# Patient Record
Sex: Female | Born: 1975
Health system: Southern US, Community
[De-identification: ages and names within clinical notes are randomized; demographics above are authoritative.]

## PROBLEM LIST (undated history)

## (undated) DIAGNOSIS — R112 Nausea with vomiting, unspecified: Secondary | ICD-10-CM

## (undated) DIAGNOSIS — H538 Other visual disturbances: Secondary | ICD-10-CM

## (undated) DIAGNOSIS — I10 Essential (primary) hypertension: Secondary | ICD-10-CM

## (undated) DIAGNOSIS — G35 Multiple sclerosis: Secondary | ICD-10-CM

## (undated) DIAGNOSIS — Z9889 Other specified postprocedural states: Secondary | ICD-10-CM

## (undated) DIAGNOSIS — G35D Multiple sclerosis, unspecified: Secondary | ICD-10-CM

## (undated) DIAGNOSIS — F319 Bipolar disorder, unspecified: Secondary | ICD-10-CM

## (undated) HISTORY — DX: Multiple sclerosis, unspecified: G35.D

## (undated) HISTORY — PX: OTHER SURGICAL HISTORY: SHX169

## (undated) HISTORY — PX: SPINE SURGERY: SHX786

## (undated) HISTORY — DX: Bipolar disorder, unspecified: F31.9

## (undated) HISTORY — DX: Multiple sclerosis: G35

---

## 1997-06-22 ENCOUNTER — Ambulatory Visit (HOSPITAL_COMMUNITY): Admission: RE | Admit: 1997-06-22 | Discharge: 1997-06-22 | Payer: Self-pay | Admitting: Obstetrics and Gynecology

## 1997-08-30 ENCOUNTER — Other Ambulatory Visit: Admission: RE | Admit: 1997-08-30 | Discharge: 1997-08-30 | Payer: Self-pay | Admitting: Obstetrics and Gynecology

## 1997-11-10 ENCOUNTER — Inpatient Hospital Stay (HOSPITAL_COMMUNITY): Admission: AD | Admit: 1997-11-10 | Discharge: 1997-11-13 | Payer: Self-pay | Admitting: Obstetrics & Gynecology

## 1997-12-07 ENCOUNTER — Encounter: Payer: Self-pay | Admitting: Pediatrics

## 1997-12-07 ENCOUNTER — Ambulatory Visit (HOSPITAL_COMMUNITY): Admission: RE | Admit: 1997-12-07 | Discharge: 1997-12-07 | Payer: Self-pay | Admitting: Pediatrics

## 1998-11-20 ENCOUNTER — Encounter: Payer: Self-pay | Admitting: General Practice

## 1998-11-20 ENCOUNTER — Ambulatory Visit (HOSPITAL_COMMUNITY): Admission: RE | Admit: 1998-11-20 | Discharge: 1998-11-20 | Payer: Self-pay | Admitting: General Practice

## 2001-07-09 ENCOUNTER — Encounter: Payer: Self-pay | Admitting: Pediatrics

## 2001-07-09 ENCOUNTER — Ambulatory Visit (HOSPITAL_COMMUNITY): Admission: RE | Admit: 2001-07-09 | Discharge: 2001-07-09 | Payer: Self-pay | Admitting: Pediatrics

## 2001-09-10 ENCOUNTER — Other Ambulatory Visit: Admission: RE | Admit: 2001-09-10 | Discharge: 2001-09-10 | Payer: Self-pay | Admitting: Obstetrics and Gynecology

## 2002-03-14 ENCOUNTER — Encounter: Payer: Self-pay | Admitting: Pediatrics

## 2002-03-14 ENCOUNTER — Ambulatory Visit (HOSPITAL_COMMUNITY): Admission: RE | Admit: 2002-03-14 | Discharge: 2002-03-14 | Payer: Self-pay | Admitting: Pediatrics

## 2002-09-05 ENCOUNTER — Emergency Department (HOSPITAL_COMMUNITY): Admission: EM | Admit: 2002-09-05 | Discharge: 2002-09-05 | Payer: Self-pay | Admitting: Emergency Medicine

## 2002-11-01 ENCOUNTER — Encounter: Payer: Self-pay | Admitting: Pediatrics

## 2002-11-01 ENCOUNTER — Ambulatory Visit (HOSPITAL_COMMUNITY): Admission: RE | Admit: 2002-11-01 | Discharge: 2002-11-01 | Payer: Self-pay | Admitting: Pediatrics

## 2005-07-06 ENCOUNTER — Emergency Department (HOSPITAL_COMMUNITY): Admission: EM | Admit: 2005-07-06 | Discharge: 2005-07-06 | Payer: Self-pay | Admitting: Emergency Medicine

## 2005-07-08 ENCOUNTER — Ambulatory Visit (HOSPITAL_COMMUNITY): Admission: RE | Admit: 2005-07-08 | Discharge: 2005-07-08 | Payer: Self-pay | Admitting: Emergency Medicine

## 2016-04-27 ENCOUNTER — Other Ambulatory Visit: Payer: Self-pay | Admitting: Family Medicine

## 2016-04-27 MED ORDER — OSELTAMIVIR PHOSPHATE 75 MG PO CAPS: 75.0000 mg | ORAL_CAPSULE | Freq: Every day | ORAL | 0 refills | Status: DC

## 2016-04-27 NOTE — Telephone Encounter (Signed)
tamiflu sent for flu contact in family. PPx dosing   Pt states 4 years since seen in our clinic.   Murtis Sink, MD [facility] 04/27/2016, 4:52 PM

## 2016-06-10 ENCOUNTER — Encounter: Payer: Self-pay | Admitting: Pediatrics

## 2016-06-10 ENCOUNTER — Ambulatory Visit: Payer: Self-pay | Admitting: Pediatrics

## 2016-06-10 ENCOUNTER — Ambulatory Visit (INDEPENDENT_AMBULATORY_CARE_PROVIDER_SITE_OTHER): Payer: BLUE CROSS/BLUE SHIELD | Admitting: Pediatrics

## 2016-06-10 VITALS — BP 136/88 | HR 84 | Temp 98.0°F | Ht 67.0 in | Wt 162.6 lb

## 2016-06-10 DIAGNOSIS — G4700 Insomnia, unspecified: Secondary | ICD-10-CM

## 2016-06-10 DIAGNOSIS — R030 Elevated blood-pressure reading, without diagnosis of hypertension: Secondary | ICD-10-CM

## 2016-06-10 DIAGNOSIS — I10 Essential (primary) hypertension: Secondary | ICD-10-CM | POA: Insufficient documentation

## 2016-06-10 DIAGNOSIS — F411 Generalized anxiety disorder: Secondary | ICD-10-CM

## 2016-06-10 DIAGNOSIS — G35 Multiple sclerosis: Secondary | ICD-10-CM | POA: Diagnosis not present

## 2016-06-10 MED ORDER — CITALOPRAM HYDROBROMIDE 10 MG PO TABS: 20.0000 mg | ORAL_TABLET | Freq: Every day | ORAL | 1 refills | Status: DC

## 2016-06-10 NOTE — Patient Instructions (Signed)
Take 10mg  citalopram for 8 days then start two tabs Take melatonin 3-9mg  30 min before bed

## 2016-06-10 NOTE — Progress Notes (Signed)
Subjective:   Patient ID: Angela Hull, female    DOB: 02/24/1976, 41 y.o.   MRN: [id number] CC: New Patient (Initial Visit) anxiety HPI: Angela Hull is a 41 y.o. female presenting for New Patient (Initial Visit)  Diagnosed with "manic depression" as a teenager Has ben non wellbutrin, zoloft Has a really good day every 2 weeks Most of the time feels really anxious A couple times a year gets so anxious she calls it "terror mode" that anything to get out including hurting herself would be better than feeling the anxiety Grandfather died recently Was in terror mode to get onto airplane for husband's business trip a few weeks ago Has upcoming trip to [place] Mood is not much of a problem now Has never done anything to hurt herself before Last time she wished she wasn't here bc of anxiety she says was 6-8 mo ago  Hard time falling asleep, worrying all the time Used to take benadryl years ago, got really worried about it because then took it every day  MS: no recent symptoms, has been years Last infusion 15+ yrs ago Not interested in establishing care with neurology  Here today with her husband who has been v supportive per pt  GAD 7 : Generalized Anxiety Score 06/10/2016  Nervous, Anxious, on Edge 3  Control/stop worrying 3  Worry too much - different things 3  Trouble relaxing 3  Restless 3  Easily annoyed or irritable 2  Afraid - awful might happen 2  Total GAD 7 Score 19  Anxiety Difficulty Very difficult   Depression screen PHQ 2/9 06/10/2016  Decreased Interest 3  Down, Depressed, Hopeless 3  PHQ - 2 Score 6  Altered sleeping 3  Tired, decreased energy 0  Change in appetite 0  Feeling bad or failure about yourself  3  Trouble concentrating 2  Moving slowly or fidgety/restless 2  Suicidal thoughts 0  PHQ-9 Score 16  Difficult doing work/chores Very difficult     Past Medical History:  Diagnosis Date  . Manic depressive disorder ([place])   . Multiple sclerosis ([place])     Family History  Problem Relation Age of Onset  . Asthma Maternal Grandmother    Social History   Social History  . Marital status: Married    Spouse name: N/A  . Number of children: N/A  . Years of education: N/A   Social History Main Topics  . Smoking status: Current Every Day Smoker    Packs/day: 0.50    Types: Cigarettes  . Smokeless tobacco: Never Used  . Alcohol use Yes     Comment: Occasional  . Drug use: No  . Sexual activity: Yes   Other Topics Concern  . None   Social History Narrative  . None   ROS: All systems negative other than what is in HPI  Objective:    BP 136/88   Pulse 84   Temp 98 F (36.7 C) (Oral)   Ht '5\' 7"'$  (1.702 m)   Wt 162 lb 9.6 oz (73.8 kg)   BMI 25.47 kg/m   Wt Readings from Last 3 Encounters:  06/10/16 162 lb 9.6 oz (73.8 kg)    Gen: NAD, alert, cooperative with exam, NCAT EYES: EOMI, +conjunctival injection b/l though pt crying, or no icterus ENT:  TMs pearly gray b/l, OP without erythema LYMPH: no cervical LAD Neck: nl thyroid CV: NRRR, normal S1/S2, no murmur, distal pulses 2+ b/l Resp: CTABL, no wheezes, normal WOB Abd: +BS, soft, NTND. no  guarding or organomegaly Ext: No edema, warm Neuro: Alert and oriented, strength equal b/l UE and LE, coordination grossly normal MSK: normal muscle bulk Psych: tearful, mood is "fine", full affect  Assessment & Plan:  [name] was seen today for new patient (initial visit) and anxiety  Diagnoses and all orders for this visit:  Generalized anxiety disorder Uncontrolled Says she feels safe at home Start citalopram, take half tab first week then full tab Check below labs -     BMP8+EGFR -     TSH -     citalopram (CELEXA) 10 MG tablet; Take 2 tablets (20 mg total) by mouth daily.  Insomnia, unspecified type Discussed sleep hygiene, can try melatonin Suspect once anxiety is better controlled, will improve  Multiple sclerosis ([place]) No symptoms now Pt does not want f/u with  neurology at this time  Elevated BP Recheck next visit Pt anxious, tearful today  Follow up plan: 3 weeks, sooner if needed discussed return precautions Assunta Found, MD [place]

## 2016-06-11 LAB — BMP8+EGFR
BUN/Creatinine Ratio: 10 (ref 9–23)
BUN: 7 mg/dL (ref 6–20)
CALCIUM: 9.2 mg/dL (ref 8.7–10.2)
CO2: 22 mmol/L (ref 18–29)
CREATININE: 0.68 mg/dL (ref 0.57–1.00)
Chloride: 98 mmol/L (ref 96–106)
GFR, EST AFRICAN AMERICAN: 127 mL/min/{1.73_m2} (ref >59)
GFR, EST NON AFRICAN AMERICAN: 111 mL/min/{1.73_m2} (ref >59)
Glucose: 93 mg/dL (ref 65–99)
Potassium: 4.2 mmol/L (ref 3.5–5.2)
Sodium: 136 mmol/L (ref 134–144)

## 2016-06-11 LAB — TSH: TSH: 0.528 u[IU]/mL (ref 0.450–4.500)

## 2016-07-08 ENCOUNTER — Ambulatory Visit (INDEPENDENT_AMBULATORY_CARE_PROVIDER_SITE_OTHER): Payer: BLUE CROSS/BLUE SHIELD | Admitting: Pediatrics

## 2016-07-08 ENCOUNTER — Encounter: Payer: Self-pay | Admitting: Pediatrics

## 2016-07-08 VITALS — BP 144/95 | HR 65 | Temp 97.3°F | Ht 67.0 in | Wt 161.8 lb

## 2016-07-08 DIAGNOSIS — R030 Elevated blood-pressure reading, without diagnosis of hypertension: Secondary | ICD-10-CM | POA: Diagnosis not present

## 2016-07-08 DIAGNOSIS — F411 Generalized anxiety disorder: Secondary | ICD-10-CM | POA: Diagnosis not present

## 2016-07-08 MED ORDER — CITALOPRAM HYDROBROMIDE 20 MG PO TABS: 40.0000 mg | ORAL_TABLET | Freq: Every day | ORAL | 4 refills | Status: DC

## 2016-07-08 NOTE — Progress Notes (Signed)
  Subjective:   Patient ID: Angela Hull, female    DOB: 01-17-1976, 41 y.o.   MRN: [id number] CC: Follow-up (Depression, )  HPI: Angela Hull is a 41 y.o. female presenting for Follow-up (Depression, )  Feels like she has noticed improvement in worrying and anxiety since starting med Sleep also better Taking 10mg  melatonin every night Taking benadryl every other day to help with sleep Has a gym membership., work has been very busy  upcoming plane trip cancelled No thoughts of self harm, not wanting to be here  Says BPs usually elevated when she comes to doctor  Relevant past medical, surgical, family and social history reviewed. Allergies and medications reviewed and updated. History  Smoking Status  . Current Every Day Smoker  . Packs/day: 0.50  . Types: Cigarettes  Smokeless Tobacco  . Never Used   ROS: Per HPI   Objective:    BP (!) 144/95   Pulse 65   Temp 97.3 F (36.3 C) (Oral)   Ht 5\' 7"  (1.702 m)   Wt 161 lb 12.8 oz (73.4 kg)   BMI 25.34 kg/m   Wt Readings from Last 3 Encounters:  07/08/16 161 lb 12.8 oz (73.4 kg)  06/10/16 162 lb 9.6 oz (73.8 kg)    Gen: NAD, alert, cooperative with exam, NCAT EYES: EOMI, no conjunctival injection, or no icterus CV: NRRR, normal S1/S2, no murmur, distal pulses 2+ b/l Resp: CTABL, no wheezes, normal WOB Ext: No edema, warm Neuro: Alert and oriented, strength equal b/l UE and LE, coordination grossly normal MSK: normal muscle bulk Psych: affect much improved, no thoughts of self harm  Assessment & Plan:  Angela Hull was seen today for follow-up multiple med problems.  Diagnoses and all orders for this visit:  Elevated blood pressure reading Very nervous today Will rtc for recheck next week  Generalized anxiety disorder Ongoing symptoms though improved Will increase to 40mg  daily Feels safe at home -     citalopram (CELEXA) 20 MG tablet; Take 2 tablets (40 mg total) by mouth daily.   Follow up plan: Return in  about 6 months (around 01/07/2017). Rex Kras, MD Angela Hull [facility]

## 2016-09-10 ENCOUNTER — Telehealth: Payer: Self-pay | Admitting: Pediatrics

## 2016-09-10 NOTE — Telephone Encounter (Signed)
Pt aware and appt made for Thursday

## 2016-09-10 NOTE — Telephone Encounter (Signed)
Needs to be seen, I have appt spots this afternoon and evening.

## 2016-09-12 ENCOUNTER — Ambulatory Visit: Payer: BLUE CROSS/BLUE SHIELD | Admitting: Pediatrics

## 2016-09-13 ENCOUNTER — Encounter: Payer: Self-pay | Admitting: Pediatrics

## 2016-12-02 ENCOUNTER — Telehealth: Payer: Self-pay | Admitting: Pediatrics

## 2016-12-02 DIAGNOSIS — F411 Generalized anxiety disorder: Secondary | ICD-10-CM

## 2016-12-02 NOTE — Telephone Encounter (Signed)
Ok to fill 

## 2016-12-02 NOTE — Telephone Encounter (Signed)
What is the name of the medication? antidepressant  Have you contacted your pharmacy to request a refill? Yes, no refills. Has appt on 10/18  Which pharmacy would you like this sent to? [facility].   Patient notified that their request is being sent to the clinical staff for review and that they should receive a call once it is complete. If they do not receive a call within 24 hours they can check with their pharmacy or our office.

## 2016-12-03 MED ORDER — CITALOPRAM HYDROBROMIDE 20 MG PO TABS: 40.0000 mg | ORAL_TABLET | Freq: Every day | ORAL | 4 refills | Status: DC

## 2016-12-03 NOTE — Telephone Encounter (Signed)
Pt aware of meds 

## 2016-12-26 ENCOUNTER — Ambulatory Visit (INDEPENDENT_AMBULATORY_CARE_PROVIDER_SITE_OTHER): Payer: BLUE CROSS/BLUE SHIELD | Admitting: Pediatrics

## 2016-12-26 ENCOUNTER — Encounter: Payer: Self-pay | Admitting: Pediatrics

## 2016-12-26 VITALS — BP 165/95 | HR 70 | Temp 97.6°F | Ht 67.0 in | Wt 169.8 lb

## 2016-12-26 DIAGNOSIS — F411 Generalized anxiety disorder: Secondary | ICD-10-CM | POA: Diagnosis not present

## 2016-12-26 DIAGNOSIS — G4700 Insomnia, unspecified: Secondary | ICD-10-CM

## 2016-12-26 DIAGNOSIS — R030 Elevated blood-pressure reading, without diagnosis of hypertension: Secondary | ICD-10-CM | POA: Diagnosis not present

## 2016-12-26 MED ORDER — CITALOPRAM HYDROBROMIDE 20 MG PO TABS: 40.0000 mg | ORAL_TABLET | Freq: Every day | ORAL | 4 refills | Status: DC

## 2016-12-26 MED ORDER — HYDROXYZINE HCL 10 MG PO TABS: 10.0000 mg | ORAL_TABLET | Freq: Three times a day (TID) | ORAL | 3 refills | Status: DC | PRN

## 2016-12-26 MED ORDER — SUVOREXANT 10 MG PO TABS: 10.0000 mg | ORAL_TABLET | Freq: Every day | ORAL | 2 refills | Status: DC

## 2016-12-26 NOTE — Patient Instructions (Addendum)
110s-120s/60s-70s  [email or web address]

## 2016-12-26 NOTE — Progress Notes (Signed)
  Subjective:   Patient ID: Angela Hull, female    DOB: 1976/01/19, 41 y.o.   MRN: [id number] CC: Follow-up (anxiety, depression)  HPI: Angela Hull is a 41 y.o. female presenting for Follow-up (anxiety, depression)  Anxiety: taking citalopram 40mg  daily Helping a lot Has 2-3 times a month has a very anxious day  Insomnia: gets home at 9-930p, hard time getting to sleep  No HA, no CP, no SOB BPs have been normal when she checks at [employer] she says, doesn't remember numbers   Here today with her husband  Relevant past medical, surgical, family and social history reviewed. Allergies and medications reviewed and updated. History  Smoking Status  . Current Every Day Smoker  . Packs/day: 0.50  . Types: Cigarettes  Smokeless Tobacco  . Never Used   ROS: Per HPI   Objective:    BP (!) 165/95   Pulse 70   Temp 97.6 F (36.4 C) (Oral)   Ht 5\' 7"  (1.702 m)   Wt 169 lb 12.8 oz (77 kg)   BMI 26.59 kg/m   Wt Readings from Last 3 Encounters:  12/26/16 169 lb 12.8 oz (77 kg)  07/08/16 161 lb 12.8 oz (73.4 kg)  06/10/16 162 lb 9.6 oz (73.8 kg)    Gen: NAD, alert, cooperative with exam, NCAT EYES: EOMI, no conjunctival injection, or no icterus ENT: OP without erythema LYMPH: no cervical LAD CV: NRRR, normal S1/S2, no murmur, distal pulses 2+ b/l Resp: CTABL, no wheezes, normal WOB Abd: +BS, soft, NTND.  Ext: No edema, warm Neuro: Alert and oriented, strength equal b/l UE and LE, coordination grossly normal MSK: normal muscle bulk  Assessment & Plan:  Angela Hull was seen today for follow-up anxiety  Diagnoses and all orders for this visit:  Generalized anxiety disorder Cont celexa Encouraged counseling, list of counselors given -     hydrOXYzine (ATARAX/VISTARIL) 10 MG tablet; Take 1 tablet (10 mg total) by mouth 3 (three) times daily as needed. -     citalopram (CELEXA) 20 MG tablet; Take 2 tablets (40 mg total) by mouth daily.  Insomnia, unspecified type Trial of  below -     Suvorexant (BELSOMRA) 10 MG TABS; Take 10-20 mg by mouth at bedtime.  Elevated blood pressure reading rtc 1 week for BP recheck Check at home regularly  Follow up plan: Return in about 3 months (around 03/28/2017). Rex Krasarol Nedda Gains, MD [facility]

## 2017-01-09 ENCOUNTER — Telehealth: Payer: Self-pay

## 2017-01-09 NOTE — Telephone Encounter (Signed)
Can you ask pt, did we give her the belsomra copay card? I might not have. I havent had that not go through yet. Insurance denied it with a prior auth but with the card I think it should be fine

## 2017-01-09 NOTE — Telephone Encounter (Signed)
Insurance denied prior auth for Belsomra  Alternatives are Zolpidem, Eszopiclone and Zaleplon

## 2017-05-23 ENCOUNTER — Other Ambulatory Visit: Payer: Self-pay | Admitting: Pediatrics

## 2017-05-23 DIAGNOSIS — F411 Generalized anxiety disorder: Secondary | ICD-10-CM

## 2017-05-23 NOTE — Telephone Encounter (Signed)
What is the name of the medication?  hydrOXYzine (ATARAX/VISTARIL) 10 MG tablet citalopram (CELEXA) 20 MG tablet  Have you contacted your pharmacy to request a refill? Yes no refill  Which pharmacy would you like this sent to? [facility]  pt has appt with Oswaldo Done 06/05/17 at 930.  Patient notified that their request is being sent to the clinical staff for review and that they should receive a call once it is complete. If they do not receive a call within 24 hours they can check with their pharmacy or our office.

## 2017-05-26 ENCOUNTER — Other Ambulatory Visit: Payer: Self-pay | Admitting: Pediatrics

## 2017-05-26 DIAGNOSIS — F411 Generalized anxiety disorder: Secondary | ICD-10-CM

## 2017-05-26 NOTE — Telephone Encounter (Signed)
Fine to refill 30 days worth

## 2017-05-26 NOTE — Telephone Encounter (Signed)
Please advise 

## 2017-05-27 MED ORDER — CITALOPRAM HYDROBROMIDE 20 MG PO TABS: 40.0000 mg | ORAL_TABLET | Freq: Every day | ORAL | 0 refills | Status: DC

## 2017-05-27 NOTE — Telephone Encounter (Signed)
Prescription sent in, no answer on patient's phone mailbox full.

## 2017-06-05 ENCOUNTER — Ambulatory Visit: Payer: BLUE CROSS/BLUE SHIELD | Admitting: Pediatrics

## 2017-06-23 ENCOUNTER — Telehealth: Payer: Self-pay | Admitting: Pediatrics

## 2017-06-23 ENCOUNTER — Encounter: Payer: Self-pay | Admitting: Pediatrics

## 2017-06-23 ENCOUNTER — Ambulatory Visit: Payer: BLUE CROSS/BLUE SHIELD | Admitting: Pediatrics

## 2017-06-23 VITALS — BP 177/109 | HR 80 | Temp 97.1°F | Ht 67.0 in | Wt 178.4 lb

## 2017-06-23 DIAGNOSIS — F411 Generalized anxiety disorder: Secondary | ICD-10-CM

## 2017-06-23 DIAGNOSIS — G4700 Insomnia, unspecified: Secondary | ICD-10-CM | POA: Diagnosis not present

## 2017-06-23 DIAGNOSIS — I10 Essential (primary) hypertension: Secondary | ICD-10-CM | POA: Diagnosis not present

## 2017-06-23 MED ORDER — CITALOPRAM HYDROBROMIDE 40 MG PO TABS: 40.0000 mg | ORAL_TABLET | Freq: Every day | ORAL | 4 refills | Status: DC

## 2017-06-23 MED ORDER — TRAZODONE HCL 50 MG PO TABS: 25.0000 mg | ORAL_TABLET | Freq: Every evening | ORAL | 3 refills | Status: DC | PRN

## 2017-06-23 MED ORDER — AMLODIPINE BESYLATE 2.5 MG PO TABS: 2.5000 mg | ORAL_TABLET | Freq: Every day | ORAL | 3 refills | Status: DC

## 2017-06-23 MED ORDER — SUVOREXANT 10 MG PO TABS: 10.0000 mg | ORAL_TABLET | Freq: Every day | ORAL | 2 refills | Status: DC

## 2017-06-23 MED ORDER — HYDROXYZINE HCL 10 MG PO TABS: ORAL_TABLET | ORAL | 4 refills | Status: DC

## 2017-06-23 NOTE — Patient Instructions (Signed)
Www.[email or web address]  Mental Health Apps and Websites Here are a few free apps meant to help you to help yourself.  To find, try searching on the internet to see if the app is offered on Apple/Android devices. If your first choice doesn't come up on your device, the good news is that there are many choices! Play around with different apps to see which ones are helpful to you . Calm This is an app meant to help increase calm feelings. Includes info, strategies, and tools for tracking your feelings.   Calm Harm  This app is meant to help with self-harm. Provides many 5-minute or 15-min coping strategies for doing instead of hurting yourself.    Healthy Minds Health Minds is a problem-solving tool to help deal with emotions and cope with stress you encounter wherever you are.    MindShift This app can help people cope with anxiety. Rather than trying to avoid anxiety, you can make an important shift and face it.    MY3  MY3 features a support system, safety plan and resources with the goal of offering a tool to use in a time of need.    My Life My Voice  This mood journal offers a simple solution for tracking your thoughts, feelings and moods. Animated emoticons can help identify your mood.   Relax Melodies Designed to help with sleep, on this app you can mix sounds and meditations for relaxation.    Smiling Mind Smiling Mind is meditation made easy: it's a simple tool that helps put a smile on your mind.    Stop, Breathe & Think  A friendly, simple guide for people through meditations for mindfulness and compassion.  Stop, Breathe and Think Kids Enter your current feelings and choose a "mission" to help you cope. Offers videos for certain moods instead of just sound recordings.     The [employer] Box The [employer] Box (VHB) contains simple tools to help patients with coping, relaxation, distraction, and positive thinking.    Your provider wants you to schedule an appointment  with a Psychologist/Psychiatrist. The following list of offices requires the patient to call and make their own appointment, as there is information they need that only you can provide. Please feel free to choose form the following providers:  [facility]   [phone] Crisis Recovery in [place] [phone]  [facility]  [phone] [place], [place]  (Scheduled through [facility]) Must call and do an interview for appointment. Sees Children / Accepts Medicaid  [facility]    [phone]  [address]    [place], [place]       [facility]  [phone] [address] [place], [place]  Evaluates for Autism but does not treat it Sees Children / Accepts Medicaid  [facility]    [phone] [address]   [place], [place] Medication management, substance abuse, bipolar, grief, family, marriage, OCD, anxiety, PTSD Sees children / Accepts Medicaid  [facility]    [phone] [address] [place], [place] Sees children / Accepts [facility]  [facility]  [phone] [address] [place], [place]   Dr Estelle Grumbles     [phone] [address] [place], [place]  Sees ADD & ADHD for treatment Accepts Medicaid  [facility]  [phone] [address] [place], [place] Evaluates for Autism Accepts Medicaid  [facility]  [phone] [address] [place], [place] Uses animal therapy  Sees children as young as 51 years old Accepts Medicaid

## 2017-06-23 NOTE — Telephone Encounter (Signed)
Patient aware.

## 2017-06-23 NOTE — Progress Notes (Signed)
  Subjective:   Patient ID: Angela Hull, female    DOB: 1976/02/25, 42 y.o.   MRN: [id number] CC: Follow-up (Medication Check)  HPI: Angela Hull is a 42 y.o. female presenting for Follow-up (Medication Check)  Anxiety: Worsening symptoms past 4 weeks with ongoing daughter's medical problems.  Taking hydroxyzine more regularly, about 3 times a week.  Does help some.  Taking Celexa 40 mg every day.  No thoughts of self-harm.  Has not yet started counseling.  Elevated blood pressure: Says she checked at home, left notebook at home.  Systolics 130s up to the 140s.  Diastolics upper 80s-90s.  No headache, no chest pain.  Insomnia: Has not yet started the Belsomra. ongoing symptoms, worse with recent stressors.  Relevant past medical, surgical, family and social history reviewed. Allergies and medications reviewed and updated. Social History   Tobacco Use  Smoking Status Current Every Day Smoker  . Packs/day: 0.50  . Types: Cigarettes  Smokeless Tobacco Never Used   ROS: Per HPI   Objective:    BP (!) 177/109   Pulse 80   Temp (!) 97.1 F (36.2 C) (Oral)   Ht 5\' 7"  (1.702 m)   Wt 178 lb 6.4 oz (80.9 kg)   BMI 27.94 kg/m   Wt Readings from Last 3 Encounters:  06/23/17 178 lb 6.4 oz (80.9 kg)  12/26/16 169 lb 12.8 oz (77 kg)  07/08/16 161 lb 12.8 oz (73.4 kg)    Gen: NAD, alert, cooperative with exam, NCAT EYES: EOMI, no conjunctival injection, or no icterus CV: NRRR, normal S1/S2, no murmur, distal pulses 2+ b/l Resp: CTABL, no wheezes, normal WOB Abd: +BS, soft, NTND.  Ext: No edema, warm Neuro: Alert and oriented, strength equal b/l UE and LE, coordination grossly normal MSK: normal muscle bulk Psych: Tearful at times.  Assessment & Plan:  Kallin was seen today for follow-up multiple medical problems.  Diagnoses and all orders for this visit:  Essential hypertension Continue to check blood pressures at home.  We will start below. -     amLODipine (NORVASC) 2.5  MG tablet; Take 1 tablet (2.5 mg total) by mouth daily. -     Basic Metabolic Panel  Generalized anxiety disorder -     citalopram (CELEXA) 40 MG tablet; Take 1 tablet (40 mg total) by mouth daily. -     hydrOXYzine (ATARAX/VISTARIL) 10 MG tablet; TAKE  (1)  TABLET  THREE TIMES DAILY AS NEEDED.  Insomnia, unspecified type -     Suvorexant (BELSOMRA) 10 MG TABS; Take 10-20 mg by mouth at bedtime.  Follow up plan: Return in about 1 month (around 07/21/2017). Rex Kras, MD [facility]

## 2017-07-25 ENCOUNTER — Ambulatory Visit: Payer: BLUE CROSS/BLUE SHIELD | Admitting: Pediatrics

## 2017-07-29 ENCOUNTER — Encounter: Payer: Self-pay | Admitting: Pediatrics

## 2017-11-26 ENCOUNTER — Encounter: Payer: Self-pay | Admitting: Family Medicine

## 2017-11-26 ENCOUNTER — Ambulatory Visit: Payer: BLUE CROSS/BLUE SHIELD | Admitting: Family Medicine

## 2017-11-26 VITALS — BP 179/89 | HR 65 | Temp 97.4°F | Ht 67.0 in | Wt 192.0 lb

## 2017-11-26 DIAGNOSIS — L03313 Cellulitis of chest wall: Secondary | ICD-10-CM

## 2017-11-26 MED ORDER — AMLODIPINE BESYLATE 2.5 MG PO TABS: 2.5000 mg | ORAL_TABLET | Freq: Every day | ORAL | 3 refills | Status: DC

## 2017-11-26 MED ORDER — CEPHALEXIN 500 MG PO CAPS: 500.0000 mg | ORAL_CAPSULE | Freq: Four times a day (QID) | ORAL | 0 refills | Status: DC

## 2017-11-26 NOTE — Addendum Note (Signed)
Addended by: Arville Care on: 11/26/2017 03:33 PM   Modules accepted: Orders

## 2017-11-26 NOTE — Progress Notes (Signed)
BP (!) 179/89   Pulse 65   Temp (!) 97.4 F (36.3 C) (Oral)   Ht 5\' 7"  (1.702 m)   Wt 192 lb (87.1 kg)   BMI 30.07 kg/m    Subjective:    Patient ID: Angela Hull, female    DOB: [birth year], 42 y.o.   MRN: [id number]  HPI: Angela Hull is a 42 y.o. female presenting on 11/26/2017 for left breast cyst (first noticed MON )   HPI Left breast cyst/abscess Patient is coming in today with left breast cyst/abscess that she started noticing 4 days ago and then she started having drainage out of it just yesterday.  She says it was very red and swollen and painful and she was concerned about it because she does have a sibling that had significant breast cyst disease which sounds like hidradenitis suppurativa.  She says that it has been slightly swollen and red around it but that was slightly better once it drained.  She says she did use some over-the-counter stuff to help get it to drain that was called cyst or abscess treatment  Relevant past medical, surgical, family and social history reviewed and updated as indicated. Interim medical history since our last visit reviewed. Allergies and medications reviewed and updated.  Review of Systems  Constitutional: Negative for chills and fever.  Eyes: Negative for visual disturbance.  Respiratory: Negative for chest tightness and shortness of breath.   Cardiovascular: Negative for chest pain and leg swelling.  Musculoskeletal: Negative for back pain and gait problem.  Skin: Positive for color change and wound. Negative for rash.  Psychiatric/Behavioral: Negative for agitation and behavioral problems.  All other systems reviewed and are negative.   Per HPI unless specifically indicated above   Allergies as of 11/26/2017   No Known Allergies     Medication List        Accurate as of 11/26/17  3:10 PM. Always use your most recent med list.          amLODipine 2.5 MG tablet Commonly known as:  NORVASC Take 1 tablet (2.5 mg total) by  mouth daily.   cephALEXin 500 MG capsule Commonly known as:  KEFLEX Take 1 capsule (500 mg total) by mouth 4 (four) times daily.   citalopram 40 MG tablet Commonly known as:  CELEXA Take 1 tablet (40 mg total) by mouth daily.   hydrOXYzine 10 MG tablet Commonly known as:  ATARAX/VISTARIL TAKE  (1)  TABLET  THREE TIMES DAILY AS NEEDED.   traZODone 50 MG tablet Commonly known as:  DESYREL Take 0.5-1 tablets (25-50 mg total) by mouth at bedtime as needed for sleep.          Objective:    BP (!) 179/89   Pulse 65   Temp (!) 97.4 F (36.3 C) (Oral)   Ht 5\' 7"  (1.702 m)   Wt 192 lb (87.1 kg)   BMI 30.07 kg/m   Wt Readings from Last 3 Encounters:  11/26/17 192 lb (87.1 kg)  06/23/17 178 lb 6.4 oz (80.9 kg)  12/26/16 169 lb 12.8 oz (77 kg)    Physical Exam  Constitutional: She is oriented to person, place, and time. She appears well-developed and well-nourished. No distress.  Eyes: Conjunctivae are normal.  Neurological: She is alert and oriented to person, place, and time. Coordination normal.  Skin: Skin is warm and dry. Lesion (Left lower outer breasts cellulitis with purulent drainage, no palpable cyst or abscess or induration noted.  About 2-1/2  x 2-1/2 cm lesion on the left lower outer breast) noted. No rash noted. She is not diaphoretic. There is erythema.  Psychiatric: She has a normal mood and affect. Her behavior is normal.  Nursing note and vitals reviewed.       Assessment & Plan:   Problem List Items Addressed This Visit    None    Visit Diagnoses    Cellulitis of chest wall    -  Primary   Left lower outer breast, told patient that if it does not clear she needs to go get a mammogram   Relevant Medications   cephALEXin (KEFLEX) 500 MG capsule      Will give patient an antibiotic and recommended using topical antibiotic, recommended to go ahead and get her mammogram as she has never had one as well. Follow up plan: Return if symptoms worsen or fail  to improve.  Counseling provided for all of the vaccine components No orders of the defined types were placed in this encounter.   Arville Care, MD [facility] 11/26/2017, 3:10 PM

## 2017-11-28 ENCOUNTER — Other Ambulatory Visit: Payer: Self-pay | Admitting: *Deleted

## 2017-11-28 MED ORDER — FLUCONAZOLE 150 MG PO TABS: ORAL_TABLET | ORAL | 1 refills | Status: DC

## 2017-11-30 LAB — ANAEROBIC AND AEROBIC CULTURE

## 2017-12-12 ENCOUNTER — Other Ambulatory Visit: Payer: Self-pay | Admitting: Pediatrics

## 2017-12-12 DIAGNOSIS — F411 Generalized anxiety disorder: Secondary | ICD-10-CM

## 2017-12-12 MED ORDER — CITALOPRAM HYDROBROMIDE 40 MG PO TABS: 40.0000 mg | ORAL_TABLET | Freq: Every day | ORAL | 0 refills | Status: DC

## 2017-12-12 NOTE — Telephone Encounter (Signed)
VM full unable to LM that 30 day RF sent to pharmacy

## 2017-12-23 ENCOUNTER — Encounter: Payer: Self-pay | Admitting: Family Medicine

## 2017-12-23 ENCOUNTER — Other Ambulatory Visit: Payer: Self-pay | Admitting: Family Medicine

## 2017-12-23 ENCOUNTER — Ambulatory Visit: Payer: BLUE CROSS/BLUE SHIELD | Admitting: Family Medicine

## 2017-12-23 VITALS — BP 162/98 | HR 72 | Temp 97.4°F | Ht 67.0 in | Wt 189.0 lb

## 2017-12-23 DIAGNOSIS — N649 Disorder of breast, unspecified: Secondary | ICD-10-CM | POA: Diagnosis not present

## 2017-12-23 DIAGNOSIS — R030 Elevated blood-pressure reading, without diagnosis of hypertension: Secondary | ICD-10-CM

## 2017-12-23 DIAGNOSIS — G4700 Insomnia, unspecified: Secondary | ICD-10-CM | POA: Diagnosis not present

## 2017-12-23 DIAGNOSIS — N6320 Unspecified lump in the left breast, unspecified quadrant: Secondary | ICD-10-CM

## 2017-12-23 DIAGNOSIS — F411 Generalized anxiety disorder: Secondary | ICD-10-CM | POA: Diagnosis not present

## 2017-12-23 MED ORDER — DOXYCYCLINE HYCLATE 100 MG PO TABS: 100.0000 mg | ORAL_TABLET | Freq: Two times a day (BID) | ORAL | 0 refills | Status: AC

## 2017-12-23 MED ORDER — TRAZODONE HCL 50 MG PO TABS: 25.0000 mg | ORAL_TABLET | Freq: Every evening | ORAL | 3 refills | Status: DC | PRN

## 2017-12-23 MED ORDER — CITALOPRAM HYDROBROMIDE 40 MG PO TABS: 40.0000 mg | ORAL_TABLET | Freq: Every day | ORAL | 0 refills | Status: DC

## 2017-12-23 NOTE — Progress Notes (Signed)
Subjective: CC: Breast cyst PCP: [name], MD KGM:[id number] Angela Hull is a 42 y.o. female presenting to clinic today for:  1. Breast cyst Patient was seen for a lesion in the left breast on 11/26/2017.  She was diagnosed with a cellulitis of the left outer breast and placed on Keflex 4 times daily for 4 weeks.  She returns today for recheck, as the lesion has not totally resolved but has improved quite a bit.  Patient reports that initially it did have quite a bit of drainage but now it seems to have gotten somewhat better.  There is some skin breakdown.  She denies any palpable masses, unplanned weight loss or night sweats.  There is a family history of ovarian cancer.  No known family history of breast cancer.  Denies any nipple discharge.  No fevers.  She has not had a mammogram yet but she plans on this, as she just turned 42 earlier this year.   ROS: Per HPI  No Known Allergies Past Medical History:  Diagnosis Date  . Manic depressive disorder (HCC)   . Multiple sclerosis (HCC)     Current Outpatient Medications:  .  amLODipine (NORVASC) 2.5 MG tablet, Take 1 tablet (2.5 mg total) by mouth daily., Disp: 30 tablet, Rfl: 3 .  citalopram (CELEXA) 40 MG tablet, Take 1 tablet (40 mg total) by mouth daily., Disp: 30 tablet, Rfl: 0 .  hydrOXYzine (ATARAX/VISTARIL) 10 MG tablet, TAKE  (1)  TABLET  THREE TIMES DAILY AS NEEDED., Disp: 30 tablet, Rfl: 4 .  traZODone (DESYREL) 50 MG tablet, Take 0.5-1 tablets (25-50 mg total) by mouth at bedtime as needed for sleep., Disp: 30 tablet, Rfl: 3 .  fluconazole (DIFLUCAN) 150 MG tablet, Take one tab now then Repeat on day 7 and again on day 14 PRN (Patient not taking: Reported on 12/23/2017), Disp: 3 tablet, Rfl: 1 Social History   Socioeconomic History  . Marital status: Married    Spouse name: Not on file  . Number of children: Not on file  . Years of education: Not on file  . Highest education level: Not on file  Occupational History  .  Not on file  Social Needs  . Financial resource strain: Not on file  . Food insecurity:    Worry: Not on file    Inability: Not on file  . Transportation needs:    Medical: Not on file    Non-medical: Not on file  Tobacco Use  . Smoking status: Current Every Day Smoker    Packs/day: 0.50    Types: Cigarettes  . Smokeless tobacco: Never Used  Substance and Sexual Activity  . Alcohol use: Yes    Comment: Occasional  . Drug use: No  . Sexual activity: Yes  Lifestyle  . Physical activity:    Days per week: Not on file    Minutes per session: Not on file  . Stress: Not on file  Relationships  . Social connections:    Talks on phone: Not on file    Gets together: Not on file    Attends religious service: Not on file    Active member of club or organization: Not on file    Attends meetings of clubs or organizations: Not on file    Relationship status: Not on file  . Intimate partner violence:    Fear of current or ex partner: Not on file    Emotionally abused: Not on file    Physically abused: Not  on file    Forced sexual activity: Not on file  Other Topics Concern  . Not on file  Social History Narrative  . Not on file   Family History  Problem Relation Age of Onset  . Asthma Maternal Grandmother     Objective: Office vital signs reviewed. BP (!) 172/92   Pulse 72   Temp (!) 97.4 F (36.3 C) (Oral)   Ht 5\' 7"  (1.702 m)   Wt 189 lb (85.7 kg)   BMI 29.60 kg/m   Physical Examination:  General: Awake, alert, well nourished, No acute distress Breast: Examined in seated position and breasts were symmetric.  No nipple inversion, peau d'orange appreciated or nipple discharge.  In supine position, breasts were examined and there were no dominant masses noted.  In the inferior left outer quadrant of the breast, there was a small healing lesion with mild skin breakdown.  No increased warmth or discharge noted.  No palpable fluctuance or induration.  The lesion seemed to be  localized exclusively to the dermis. Lymph nodes: No enlargement of the supraclavicular or axillary lymph nodes appreciated.  Assessment/ Plan: 42 y.o. female   1. Breast lesion Likely a healing abscess of the breast.  However, I do agree that mammography and further evaluation would be warranted.  She has a family history of ovarian cancer.  I have placed a referral to general surgery as well to further evaluate should the lesion not completely heal after an additional round of antibiotics.  Doxycycline 100 mg p.o. twice daily prescribed for 7 days.  Mammogram and breast ultrasound ordered.  She will have this done at the breast center in [place]. - Ambulatory referral to General Surgery  2. Elevated blood pressure reading We discussed ambulatory blood pressure monitoring.  Patient will come in in a couple of days to have this set up and return as scheduled with her PCP for interpretation.  3. Generalized anxiety disorder - citalopram (CELEXA) 40 MG tablet; Take 1 tablet (40 mg total) by mouth daily.  Dispense: 30 tablet; Refill: 0  4. Insomnia, unspecified type Refill of trazodone sent.   Orders Placed This Encounter  Procedures  . Ambulatory referral to General Surgery    Referral Priority:   Routine    Referral Type:   Surgical    Referral Reason:   Specialty Services Required    Requested Specialty:   General Surgery    Number of Visits Requested:   1   Meds ordered this encounter  Medications  . citalopram (CELEXA) 40 MG tablet    Sig: Take 1 tablet (40 mg total) by mouth daily.    Dispense:  30 tablet    Refill:  0  . traZODone (DESYREL) 50 MG tablet    Sig: Take 0.5-1 tablets (25-50 mg total) by mouth at bedtime as needed for sleep.    Dispense:  30 tablet    Refill:  3  . doxycycline (VIBRA-TABS) 100 MG tablet    Sig: Take 1 tablet (100 mg total) by mouth 2 (two) times daily for 7 days.    Dispense:  14 tablet    Refill:  0     Angela Hoogendoorn Hulen Skains, DO [facility] [phone]

## 2017-12-23 NOTE — Patient Instructions (Signed)
I sent in doxycycline to cover for any infectious component.  Take this twice a day with a meal.  I have placed a referral to the breast surgeon, ordered a mammogram and an ultrasound of left breast.  You will be contacted to schedule this appointment. Your medications have been renewed for 1 month.  Follow-up with Dr. Oswaldo Done as scheduled. You have been placed on an ambulatory blood pressure monitoring system to wear for the next 24 hours.  Dr. Oswaldo Done will go over the results of this with you at your next visit.

## 2017-12-24 DIAGNOSIS — N649 Disorder of breast, unspecified: Secondary | ICD-10-CM | POA: Insufficient documentation

## 2017-12-26 ENCOUNTER — Other Ambulatory Visit: Payer: Self-pay

## 2017-12-31 ENCOUNTER — Ambulatory Visit: Payer: BLUE CROSS/BLUE SHIELD | Admitting: Pediatrics

## 2017-12-31 ENCOUNTER — Encounter: Payer: Self-pay | Admitting: Pediatrics

## 2017-12-31 VITALS — BP 156/85 | HR 69 | Temp 98.3°F | Ht 67.0 in | Wt 188.0 lb

## 2017-12-31 DIAGNOSIS — F411 Generalized anxiety disorder: Secondary | ICD-10-CM | POA: Diagnosis not present

## 2017-12-31 DIAGNOSIS — R635 Abnormal weight gain: Secondary | ICD-10-CM

## 2017-12-31 DIAGNOSIS — Z23 Encounter for immunization: Secondary | ICD-10-CM

## 2017-12-31 DIAGNOSIS — I10 Essential (primary) hypertension: Secondary | ICD-10-CM

## 2017-12-31 MED ORDER — CITALOPRAM HYDROBROMIDE 40 MG PO TABS: 40.0000 mg | ORAL_TABLET | Freq: Every day | ORAL | 1 refills | Status: DC

## 2017-12-31 MED ORDER — AMLODIPINE BESYLATE 5 MG PO TABS: 5.0000 mg | ORAL_TABLET | Freq: Every day | ORAL | 1 refills | Status: DC

## 2017-12-31 NOTE — Progress Notes (Signed)
  Subjective:   Patient ID: Angela Hull, female    DOB: 1975-06-21, 42 y.o.   MRN: [id number] CC: Follow-up (medications)  HPI: Angela Hull is a 42 y.o. female   Essential hypertension: No headaches, vision changes.  Taking amlodipine 2.5 mg regularly.  No lightheadedness or dizziness.  Anxiety: Symptoms better with the citalopram.  Not needing to take hydroxyzine.  Work remains quite stressful.  Weight gain: Has gained about 20 pounds over the last year.  Wonders if it could be medication related.  She has not changed which she is eating.  Eating 3 meals a day.  Rarely snacking.  Not drinking sugary drinks regularly.  Does snore.  Has not been told she has pauses with her breathing.  No morning headaches.  No daytime fatigue.  Insomnia: Taking trazodone as needed, not taking every night.  Has been sleeping all right for the most part.  Tobacco use: Smoking daily, has been cutting back  Relevant past medical, surgical, family and social history reviewed. Allergies and medications reviewed and updated. Social History   Tobacco Use  Smoking Status Current Every Day Smoker  . Packs/day: 0.50  . Types: Cigarettes  Smokeless Tobacco Never Used   ROS: Per HPI   Objective:    BP (!) 156/85   Pulse 69   Temp 98.3 F (36.8 C)   Ht '5\' 7"'$  (1.702 m)   Wt 188 lb (85.3 kg)   BMI 29.44 kg/m   Wt Readings from Last 3 Encounters:  12/31/17 188 lb (85.3 kg)  12/23/17 189 lb (85.7 kg)  11/26/17 192 lb (87.1 kg)    Gen: NAD, alert, cooperative with exam, NCAT EYES: EOMI, no conjunctival injection, or no icterus ENT:  TMs pearly gray b/l, OP without erythema LYMPH: no cervical LAD Neck: Normal thyroid CV: NRRR, normal S1/S2, no murmur, distal pulses 2+ b/l Resp: CTABL, no wheezes, normal WOB Abd: +BS, soft, NTND. no guarding or organomegaly Ext: No edema, warm Neuro: Alert and oriented, strength equal b/l UE and LE, coordination grossly normal Psych: Normal affect, tearful at  times.  Assessment & Plan:  Eliz was seen today for follow-up multiple medical problems.  Diagnoses and all orders for this visit:  Essential hypertension Elevated today.  Increase to 5 mg.  Continue to check blood pressures at home.  Let me know if persistently greater than 338 systolics or any lightheaded or dizziness. -     amLODipine (NORVASC) 5 MG tablet; Take 1 tablet (5 mg total) by mouth daily. -     CMP14+EGFR  Generalized anxiety disorder Improved control on below.  Continue. -     citalopram (CELEXA) 40 MG tablet; Take 1 tablet (40 mg total) by mouth daily.  Weight gain Continue lifestyle modifications.  Discussed possible citalopram related.  Citalopram has been helping with anxiety enough, patient would like to leave alone for now. -     TSH  Need for immunization against influenza -     Flu Vaccine QUAD 36+ mos IM   Follow up plan: Return in about 6 months (around 07/02/2018). Assunta Found, MD [place]

## 2018-01-01 LAB — CMP14+EGFR
A/G RATIO: 2.1 (ref 1.2–2.2)
ALBUMIN: 4.7 g/dL (ref 3.5–5.5)
ALK PHOS: 89 IU/L (ref 39–117)
ALT: 17 IU/L (ref 0–32)
AST: 26 IU/L (ref 0–40)
BUN / CREAT RATIO: 11 (ref 9–23)
BUN: 7 mg/dL (ref 6–24)
Bilirubin Total: 0.7 mg/dL (ref 0.0–1.2)
CHLORIDE: 101 mmol/L (ref 96–106)
CO2: 24 mmol/L (ref 20–29)
Calcium: 9.3 mg/dL (ref 8.7–10.2)
Creatinine, Ser: 0.64 mg/dL (ref 0.57–1.00)
GFR calc non Af Amer: 112 mL/min/{1.73_m2} (ref >59)
GFR, EST AFRICAN AMERICAN: 129 mL/min/{1.73_m2} (ref >59)
GLUCOSE: 88 mg/dL (ref 65–99)
Globulin, Total: 2.2 g/dL (ref 1.5–4.5)
POTASSIUM: 4.1 mmol/L (ref 3.5–5.2)
Sodium: 140 mmol/L (ref 134–144)
TOTAL PROTEIN: 6.9 g/dL (ref 6.0–8.5)

## 2018-01-01 LAB — TSH: TSH: 0.819 u[IU]/mL (ref 0.450–4.500)

## 2018-03-18 ENCOUNTER — Telehealth: Payer: Self-pay | Admitting: Pediatrics

## 2018-03-18 MED ORDER — OSELTAMIVIR PHOSPHATE 75 MG PO CAPS: 75.0000 mg | ORAL_CAPSULE | Freq: Every day | ORAL | 0 refills | Status: DC

## 2018-03-19 MED ORDER — OSELTAMIVIR PHOSPHATE 75 MG PO CAPS: 75.0000 mg | ORAL_CAPSULE | Freq: Every day | ORAL | 0 refills | Status: DC

## 2018-03-19 NOTE — Telephone Encounter (Signed)
Please advise 

## 2018-03-25 NOTE — Telephone Encounter (Signed)
Attempts to contact pt without return call in over 3 days, will close encounter. 

## 2018-04-29 ENCOUNTER — Ambulatory Visit: Payer: BLUE CROSS/BLUE SHIELD | Admitting: Family Medicine

## 2018-05-01 ENCOUNTER — Encounter: Payer: Self-pay | Admitting: Family Medicine

## 2018-05-12 ENCOUNTER — Encounter: Payer: Self-pay | Admitting: Family Medicine

## 2018-05-12 ENCOUNTER — Ambulatory Visit: Payer: BLUE CROSS/BLUE SHIELD | Admitting: Family Medicine

## 2018-05-12 ENCOUNTER — Ambulatory Visit (INDEPENDENT_AMBULATORY_CARE_PROVIDER_SITE_OTHER): Payer: BLUE CROSS/BLUE SHIELD

## 2018-05-12 VITALS — BP 181/108 | HR 69 | Temp 97.1°F | Ht 67.0 in | Wt 183.0 lb

## 2018-05-12 DIAGNOSIS — R5383 Other fatigue: Secondary | ICD-10-CM

## 2018-05-12 DIAGNOSIS — M2550 Pain in unspecified joint: Secondary | ICD-10-CM | POA: Diagnosis not present

## 2018-05-12 DIAGNOSIS — S6991XA Unspecified injury of right wrist, hand and finger(s), initial encounter: Secondary | ICD-10-CM

## 2018-05-12 DIAGNOSIS — Z23 Encounter for immunization: Secondary | ICD-10-CM | POA: Diagnosis not present

## 2018-05-12 DIAGNOSIS — F411 Generalized anxiety disorder: Secondary | ICD-10-CM | POA: Diagnosis not present

## 2018-05-12 DIAGNOSIS — S61210A Laceration without foreign body of right index finger without damage to nail, initial encounter: Secondary | ICD-10-CM | POA: Diagnosis not present

## 2018-05-12 DIAGNOSIS — L03011 Cellulitis of right finger: Secondary | ICD-10-CM

## 2018-05-12 DIAGNOSIS — I10 Essential (primary) hypertension: Secondary | ICD-10-CM | POA: Diagnosis not present

## 2018-05-12 MED ORDER — AMLODIPINE BESYLATE 5 MG PO TABS: 5.0000 mg | ORAL_TABLET | Freq: Every day | ORAL | 1 refills | Status: DC

## 2018-05-12 MED ORDER — SULFAMETHOXAZOLE-TRIMETHOPRIM 800-160 MG PO TABS: 1.0000 | ORAL_TABLET | Freq: Two times a day (BID) | ORAL | 0 refills | Status: AC

## 2018-05-12 MED ORDER — CITALOPRAM HYDROBROMIDE 40 MG PO TABS: 40.0000 mg | ORAL_TABLET | Freq: Every day | ORAL | 1 refills | Status: DC

## 2018-05-12 NOTE — Patient Instructions (Signed)
Fingertip Infection  There are two main types of fingertip infections:  · Paronychia. This is an infection that happens around your nail. This type of infection can start suddenly in one nail or occur gradually over time and affect more than one nail. Long-term (chronic) paronychia can make your fingernails thick and deformed.  · Felon. This is a bacterial infection in the padded tip of your finger. Felon infection can cause a painful collection of pus (abscess) to form inside your fingertip. If the infection is not treated, the infection can spread as deep as the bone.  What are the causes?  Paronychia infection can be caused by bacteria, funguses, or a mix of both. Felon infection is usually caused by the bacteria that are normally found on your skin. An infection can develop if the bacteria spread through your skin to the pad of tissue inside your fingertip.  What increases the risk?  A fingertip infection is more likely to develop in people:  · Who have diabetes.  · Who have a weak body defense (immune) system.  · Who work with their hands.  · Whose hands are exposed to moisture, chemicals, or irritants for long periods of time.  · Who have poor circulation.  · Who bite, chew, or pick their fingernails.  What are the signs or symptoms?  Symptoms of paronychia may affect one or more fingernails and include:  · Pain, swelling, and redness around the nail.  · Pus-filled pockets at the base or side of the fingernail (cuticle).  · Thick fingernails that separate from the nail bed.  · Pus that drains from the nail bed.  Symptoms of felon usually affect just one fingertip pad and include:  · Severe, throbbing pain.  · Redness.  · Swelling.  · Warmth.  · Tenderness when the affected fingertip is touched.  How is this diagnosed?  A fingertip infection is diagnosed with a medical history and physical exam. If there is pus draining from the infection, it may be swabbed and sent to the lab for a culture. An X-ray may be  done to see if the infection has spread to the bone.  How is this treated?  Treatment for a fingertip infection may include:  · Warm water or salt-water soaks several times per day.  · Antibiotic medicine. This may be an ointment or pills.  · Steroid ointment.  · Antifungal pills.  · Drainage of pus pockets. This is done by making a surgical cut (incision) to open the fingertip to drain pus.  · Wearing gloves to protect your nails.  Follow these instructions at home:  Medicines  · Take or apply over-the-counter and prescription medicines only as told by your health care provider.  · If you were prescribed antibiotic medicine, take or apply it as told by your health care provider. Do not stop using the antibiotic even if your condition improves.  Wound care  · Clean the infected area each day with warm water or salt water, or as told by your health care provider.  ? Gently wash the infected area with mild soap and water.  ? Rinse the infected area with water to remove all soap.  ? Pat the infected area dry with a clean towel. Do not rub it.  ? To make a salt-water mixture, completely dissolve ½-1 tsp of salt in 1 cup of warm water.  · Follow instructions from your health care provider about:  ? How to take care of the infection.  ?   When and how you should change your bandage (dressing).  ? When you should remove your dressing.  · Check the infected area every day for more signs of infection. Watch for:  ? More redness, swelling, or pain.  ? More fluid or blood.  ? Warmth.  ? A bad smell.  General instructions  · Keep the dressing dry until your health care provider says it can be removed. Do not take baths, swim, use a hot tub, or do anything that would put your wound underwater until your health care provider approves.  · Raise (elevate) the infected area above the level of your heart while you are sitting or lying down or as told by your health care provider.  · Do not scratch or pick at the infection.  · Wear  gloves as told by your health care provider, if this applies.  · Keep all follow-up visits as told by your health care provider. This is important.  How is this prevented?  · Wear gloves when you work with your hands.  · Wash your hands often with antibacterial soap.  · Avoid letting your hands stay wet or irritated for long periods of time.  · Do not bite your fingernails. Do not pull on your cuticles. Do not suck on your fingers.  · Use clean scissors or nail clippers to trim your nails. Do not cut your fingernails very short.  Contact a health care provider if:  · Your pain medicine is not helping.  · You have more redness, swelling, or pain at your fingertip.  · You continue to have fluid, blood, or pus coming from your fingertip.  · Your infection area feels warm to the touch.  · You continue to notice a bad smell coming from your fingertip or your dressing.  Get help right away if:  · The area of redness is spreading, or you notice a red streak going away from your fingertip.  · You have a fever.  This information is not intended to replace advice given to you by your health care provider. Make sure you discuss any questions you have with your health care provider.  Document Released: 04/04/2004 Document Revised: 08/03/2015 Document Reviewed: 08/15/2014  [employer] Interactive Patient Education © 2019 [employer]

## 2018-05-12 NOTE — Progress Notes (Signed)
Subjective:  Patient ID: Angela Hull, female    DOB: 1975/04/17, 43 y.o.   MRN: [id number]  Chief Complaint:  Medical Management of Chronic Issues; Joint Pain (wrists and fingers); and Blood blister on right index finger (about 6 months ago hit finger while wiping countertops on a nail, felt fingernail shift, since then has been getting blisters on end of fingertip)   HPI: Angela Hull is a 43 y.o. female presenting on 05/12/2018 for Medical Management of Chronic Issues; Joint Pain (wrists and fingers); and Blood blister on right index finger (about 6 months ago hit finger while wiping countertops on a nail, felt fingernail shift, since then has been getting blisters on end of fingertip)   1. Generalized anxiety disorder  Doing well overall. States she is taking her medications without associated side effects. States she is happy with her current treatment. She denies SI or HI.  Depression screen [facility] 2/9 05/12/2018 12/31/2017 12/23/2017 11/26/2017 06/23/2017  Decreased Interest '1 1 1 '$ 0 3  Down, Depressed, Hopeless 1 1 0 0 3  PHQ - 2 Score '2 2 1 '$ 0 6  Altered sleeping 0 1 - - 3  Tired, decreased energy 0 1 - - 2  Change in appetite 0 0 - - 2  Feeling bad or failure about yourself  1 1 - - 0  Trouble concentrating 1 0 - - 2  Moving slowly or fidgety/restless 0 0 - - 3  Suicidal thoughts 0 0 - - 0  PHQ-9 Score 4 5 - - 18  Difficult doing work/chores - - - - Somewhat difficult   GAD 7 : Generalized Anxiety Score 05/12/2018 06/23/2017 12/26/2016 07/08/2016  Nervous, Anxious, on Edge 1 3 0 1  Control/stop worrying 1 3 0 1  Worry too much - different things 1 3 0 1  Trouble relaxing 1 3 0 1  Restless 0 2 0 0  Easily annoyed or irritable 0 0 0 0  Afraid - awful might happen 0 0 0 0  Total GAD 7 Score 4 14 0 4  Anxiety Difficulty Not difficult at all Somewhat difficult - Somewhat difficult      2. Multiple joint pain  She reports ongoing wrist, elbow, and finger pain. States she has  swelling of her hands and wrist. States this started several months ago. States it is worse after working or repetitive use. States she has not tried anything for the pain. States it is aching to sharp and a 7/10 at worst.    3. Injury of right index finger, initial encounter  Pt states she injured her right index finger several weeks ago at work. States she smashed the end of her finger against the end of the table and hit a nail that injured her nail and cut her finger. She states she has had intermittent swelling and redness to the end of her finger. States now it has developed a blister. She reports it is throbbing and very tender to touch, 10/10 when you touch it. She denies numbness or loss of function. No chills or fever.   4. Other fatigue  Ongoing for several months, worse over last few weeks.    5. Essential hypertension  Complaint with meds - Yes Checking BP at home ranging 120/80. States she has severe white coat syndrome.  Exercising Regularly - No Watching Salt intake - Yes Pertinent ROS:  Headache - No Chest pain - No Dyspnea - No Palpitations - No LE edema - No  They report good compliance with medications and can restate their regimen by memory. No medication side effects.  BP Readings from Last 3 Encounters:  05/12/18 (!) 181/108  12/31/17 (!) 156/85  12/23/17 (!) 162/98        Relevant past medical, surgical, family, and social history reviewed and updated as indicated.  Allergies and medications reviewed and updated.   Past Medical History:  Diagnosis Date  . Manic depressive disorder ([place])   . Multiple sclerosis ([place])     Past Surgical History:  Procedure Laterality Date  . CESAREAN SECTION    . SPINE SURGERY    . Uterine Ablation      Social History   Socioeconomic History  . Marital status: Married    Spouse name: Not on file  . Number of children: Not on file  . Years of education: Not on file  . Highest education level: Not on file    Occupational History  . Not on file  Social Needs  . Financial resource strain: Not on file  . Food insecurity:    Worry: Not on file    Inability: Not on file  . Transportation needs:    Medical: Not on file    Non-medical: Not on file  Tobacco Use  . Smoking status: Current Every Day Smoker    Packs/day: 0.50    Types: Cigarettes  . Smokeless tobacco: Never Used  Substance and Sexual Activity  . Alcohol use: Yes    Comment: Occasional  . Drug use: No  . Sexual activity: Yes  Lifestyle  . Physical activity:    Days per week: Not on file    Minutes per session: Not on file  . Stress: Not on file  Relationships  . Social connections:    Talks on phone: Not on file    Gets together: Not on file    Attends religious service: Not on file    Active member of club or organization: Not on file    Attends meetings of clubs or organizations: Not on file    Relationship status: Not on file  . Intimate partner violence:    Fear of current or ex partner: Not on file    Emotionally abused: Not on file    Physically abused: Not on file    Forced sexual activity: Not on file  Other Topics Concern  . Not on file  Social History Narrative  . Not on file    Outpatient Encounter Medications as of 05/12/2018  Medication Sig  . amLODipine (NORVASC) 5 MG tablet Take 1 tablet (5 mg total) by mouth daily.  . citalopram (CELEXA) 40 MG tablet Take 1 tablet (40 mg total) by mouth daily.  . fluconazole (DIFLUCAN) 150 MG tablet Take one tab now then Repeat on day 7 and again on day 14 PRN  . traZODone (DESYREL) 50 MG tablet Take 0.5-1 tablets (25-50 mg total) by mouth at bedtime as needed for sleep.  . [DISCONTINUED] amLODipine (NORVASC) 5 MG tablet Take 1 tablet (5 mg total) by mouth daily.  . [DISCONTINUED] citalopram (CELEXA) 40 MG tablet Take 1 tablet (40 mg total) by mouth daily.  Marland Kitchen sulfamethoxazole-trimethoprim (BACTRIM DS) 800-160 MG tablet Take 1 tablet by mouth 2 (two) times daily for  7 days.  . [DISCONTINUED] hydrOXYzine (ATARAX/VISTARIL) 10 MG tablet TAKE  (1)  TABLET  THREE TIMES DAILY AS NEEDED. (Patient not taking: Reported on 12/31/2017)  . [DISCONTINUED] oseltamivir (TAMIFLU) 75 MG capsule Take 1 capsule (75  mg total) by mouth daily.   No facility-administered encounter medications on file as of 05/12/2018.     No Known Allergies  Review of Systems  Constitutional: Negative for activity change, appetite change, chills, diaphoresis, fever and unexpected weight change.  Eyes: Negative for photophobia and visual disturbance.  Respiratory: Negative for cough, chest tightness and shortness of breath.   Cardiovascular: Negative for chest pain, palpitations and leg swelling.  Gastrointestinal: Negative for abdominal pain.  Endocrine: Negative for cold intolerance, heat intolerance, polydipsia, polyphagia and polyuria.  Musculoskeletal: Positive for arthralgias and joint swelling. Negative for myalgias.  Skin: Positive for wound (right index finger).  Neurological: Negative for dizziness, tremors, seizures, syncope, facial asymmetry, speech difficulty, weakness, light-headedness, numbness and headaches.  Psychiatric/Behavioral: Negative for confusion.  All other systems reviewed and are negative.       Objective:  BP (!) 181/108   Pulse 69   Temp (!) 97.1 F (36.2 C) (Oral)   Ht '5\' 7"'$  (1.702 m)   Wt 183 lb (83 kg)   BMI 28.66 kg/m    Wt Readings from Last 3 Encounters:  05/12/18 183 lb (83 kg)  12/31/17 188 lb (85.3 kg)  12/23/17 189 lb (85.7 kg)    Physical Exam Vitals signs and nursing note reviewed.  Constitutional:      General: She is not in acute distress.    Appearance: Normal appearance. She is well-developed and well-groomed. She is not ill-appearing or toxic-appearing.  HENT:     Head: Normocephalic and atraumatic.     Right Ear: Tympanic membrane and ear canal normal.     Left Ear: Tympanic membrane, ear canal and external ear normal.      Nose: Nose normal.     Mouth/Throat:     Mouth: Mucous membranes are moist.     Pharynx: Oropharynx is clear.  Eyes:     General: Lids are normal.     Extraocular Movements: Extraocular movements intact.     Conjunctiva/sclera: Conjunctivae normal.     Pupils: Pupils are equal, round, and reactive to light.  Neck:     Musculoskeletal: Normal range of motion and neck supple. No neck rigidity.     Vascular: No carotid bruit or JVD.     Trachea: Trachea and phonation normal.  Cardiovascular:     Rate and Rhythm: Normal rate and regular rhythm.     Chest Wall: PMI is not displaced.     Pulses: Normal pulses.     Heart sounds: Normal heart sounds. No murmur. No friction rub. No gallop.   Pulmonary:     Effort: Pulmonary effort is normal. No respiratory distress.     Breath sounds: Normal breath sounds.  Abdominal:     General: Bowel sounds are normal. There is no abdominal bruit.     Palpations: Abdomen is soft.     Tenderness: There is no abdominal tenderness.  Musculoskeletal:     Right wrist: Normal.     Left wrist: Normal.     Right forearm: Normal.     Left forearm: Normal.     Right hand: She exhibits tenderness and swelling. She exhibits normal range of motion, no bony tenderness and normal capillary refill. Normal sensation noted. Normal strength noted.     Left hand: Normal.       Hands:  Skin:    General: Skin is warm and dry.     Capillary Refill: Capillary refill takes less than 2 seconds.     Findings:  Wound (to right index finger as noted) present.  Neurological:     General: No focal deficit present.     Mental Status: She is alert and oriented to person, place, and time.     Cranial Nerves: No cranial nerve deficit.     Sensory: No sensory deficit.     Motor: No weakness.     Coordination: Coordination normal.     Gait: Gait normal.     Deep Tendon Reflexes: Reflexes normal.  Psychiatric:        Attention and Perception: Attention and perception normal.          Mood and Affect: Mood and affect normal.        Speech: Speech normal.        Behavior: Behavior normal. Behavior is cooperative.        Thought Content: Thought content normal. Thought content does not include homicidal or suicidal ideation. Thought content does not include homicidal or suicidal plan.        Cognition and Memory: Cognition and memory normal.        Judgment: Judgment normal.     Results for orders placed or performed in visit on 12/31/17  TSH  Result Value Ref Range   TSH 0.819 0.450 - 4.500 uIU/mL  CMP14+EGFR  Result Value Ref Range   Glucose 88 65 - 99 mg/dL   BUN 7 6 - 24 mg/dL   Creatinine, Ser 0.64 0.57 - 1.00 mg/dL   GFR calc non Af Amer 112 >59 mL/min/1.73   GFR calc Af Amer 129 >59 mL/min/1.73   BUN/Creatinine Ratio 11 9 - 23   Sodium 140 134 - 144 mmol/L   Potassium 4.1 3.5 - 5.2 mmol/L   Chloride 101 96 - 106 mmol/L   CO2 24 20 - 29 mmol/L   Calcium 9.3 8.7 - 10.2 mg/dL   Total Protein 6.9 6.0 - 8.5 g/dL   Albumin 4.7 3.5 - 5.5 g/dL   Globulin, Total 2.2 1.5 - 4.5 g/dL   Albumin/Globulin Ratio 2.1 1.2 - 2.2   Bilirubin Total 0.7 0.0 - 1.2 mg/dL   Alkaline Phosphatase 89 39 - 117 IU/L   AST 26 0 - 40 IU/L   ALT 17 0 - 32 IU/L     X-Ray: Right index finger: not concerning for osteomyelitis. No acute findings. Preliminary x-ray reading by Monia Pouch, FNP-C, [facility].    Pertinent labs & imaging results that were available during my care of the patient were reviewed by me and considered in my medical decision making.  Assessment & Plan:  Marla was seen today for medical management of chronic issues, joint pain and blood blister on right index finger.  Diagnoses and all orders for this visit:  Generalized anxiety disorder Stable, continue below. Labs pending.  -     TSH -     citalopram (CELEXA) 40 MG tablet; Take 1 tablet (40 mg total) by mouth daily.  Multiple joint pain Labs pending. Symptomatic care discussed.  -     ANA,IFA RA  Diag Pnl w/rflx Tit/Patn  Injury of right index finger, initial encounter Xray unremarkable, will notify pt if radiology reading differs.  -     DG Finger Index Right; Future -     CBC with Differential/Platelet -     Tdap vaccine greater than or equal to 7yo IM  Other fatigue Labs pending, sleep hygiene discussed.  -     CBC with Differential/Platelet -  CMP14+EGFR -     TSH  Essential hypertension White coat syndrome. Has great readings at home. Labs pending. Continue below. Report any persistent high readings.  -     CBC with Differential/Platelet -     CMP14+EGFR -     Lipid panel -     TSH -     amLODipine (NORVASC) 5 MG tablet; Take 1 tablet (5 mg total) by mouth daily.  Felon of finger of right hand Findings consistent with right index finger felon. Xray not concerning for osteomyelitis. Referral to hand surgery placed. Report any new or worsening symptoms. Reevaluate in one week.  -     sulfamethoxazole-trimethoprim (BACTRIM DS) 800-160 MG tablet; Take 1 tablet by mouth 2 (two) times daily for 7 days. -     Ambulatory referral to Hand Surgery     Continue all other maintenance medications.  Follow up plan: Return in about 1 week (around 05/19/2018), or if symptoms worsen or fail to improve.  Educational handout given for fingertip infection   The above assessment and management plan was discussed with the patient. The patient verbalized understanding of and has agreed to the management plan. Patient is aware to call the clinic if symptoms persist or worsen. Patient is aware when to return to the clinic for a follow-up visit. Patient educated on when it is appropriate to go to the emergency department.   Monia Pouch, FNP-C [facility] [phone]

## 2018-05-14 LAB — CBC WITH DIFFERENTIAL/PLATELET
Basophils Absolute: 0.1 10*3/uL (ref 0.0–0.2)
Basos: 1 %
EOS (ABSOLUTE): 0.6 10*3/uL — ABNORMAL HIGH (ref 0.0–0.4)
EOS: 5 %
Hematocrit: 43.5 % (ref 34.0–46.6)
Hemoglobin: 15.2 g/dL (ref 11.1–15.9)
Immature Grans (Abs): 0.1 10*3/uL (ref 0.0–0.1)
Immature Granulocytes: 1 %
LYMPHS ABS: 1.4 10*3/uL (ref 0.7–3.1)
Lymphs: 13 %
MCH: 35.3 pg — AB (ref 26.6–33.0)
MCHC: 34.9 g/dL (ref 31.5–35.7)
MCV: 101 fL — ABNORMAL HIGH (ref 79–97)
MONOS ABS: 0.8 10*3/uL (ref 0.1–0.9)
Monocytes: 7 %
NEUTROS ABS: 7.9 10*3/uL — AB (ref 1.4–7.0)
Neutrophils: 73 %
Platelets: 327 10*3/uL (ref 150–450)
RBC: 4.31 x10E6/uL (ref 3.77–5.28)
RDW: 11.8 % (ref 11.7–15.4)
WBC: 10.9 10*3/uL — AB (ref 3.4–10.8)

## 2018-05-14 LAB — CMP14+EGFR
ALT: 13 IU/L (ref 0–32)
AST: 19 IU/L (ref 0–40)
Albumin/Globulin Ratio: 1.9 (ref 1.2–2.2)
Albumin: 4.3 g/dL (ref 3.8–4.8)
Alkaline Phosphatase: 80 IU/L (ref 39–117)
BUN/Creatinine Ratio: 10 (ref 9–23)
BUN: 7 mg/dL (ref 6–24)
Bilirubin Total: 0.2 mg/dL (ref 0.0–1.2)
CALCIUM: 9.1 mg/dL (ref 8.7–10.2)
CO2: 22 mmol/L (ref 20–29)
Chloride: 102 mmol/L (ref 96–106)
Creatinine, Ser: 0.69 mg/dL (ref 0.57–1.00)
GFR calc Af Amer: 125 mL/min/{1.73_m2} (ref >59)
GFR, EST NON AFRICAN AMERICAN: 108 mL/min/{1.73_m2} (ref >59)
Globulin, Total: 2.3 g/dL (ref 1.5–4.5)
Glucose: 87 mg/dL (ref 65–99)
Potassium: 4.7 mmol/L (ref 3.5–5.2)
Sodium: 138 mmol/L (ref 134–144)
Total Protein: 6.6 g/dL (ref 6.0–8.5)

## 2018-05-14 LAB — ANA,IFA RA DIAG PNL W/RFLX TIT/PATN
ANA TITER 1: NEGATIVE
CYCLIC CITRULLIN PEPTIDE AB: 9 U (ref 0–19)
Rhuematoid fact SerPl-aCnc: <10 IU/mL (ref 0.0–13.9)

## 2018-05-14 LAB — LIPID PANEL
Chol/HDL Ratio: 2.5 ratio (ref 0.0–4.4)
Cholesterol, Total: 186 mg/dL (ref 100–199)
HDL: 75 mg/dL (ref >39)
LDL Calculated: 96 mg/dL (ref 0–99)
TRIGLYCERIDES: 76 mg/dL (ref 0–149)
VLDL Cholesterol Cal: 15 mg/dL (ref 5–40)

## 2018-05-14 LAB — TSH: TSH: 1.17 u[IU]/mL (ref 0.450–4.500)

## 2018-05-20 ENCOUNTER — Encounter (HOSPITAL_BASED_OUTPATIENT_CLINIC_OR_DEPARTMENT_OTHER): Payer: Self-pay | Admitting: *Deleted

## 2018-05-20 ENCOUNTER — Other Ambulatory Visit: Payer: Self-pay | Admitting: Orthopedic Surgery

## 2018-05-20 ENCOUNTER — Other Ambulatory Visit: Payer: Self-pay

## 2018-05-20 DIAGNOSIS — L03011 Cellulitis of right finger: Secondary | ICD-10-CM | POA: Diagnosis not present

## 2018-05-21 ENCOUNTER — Other Ambulatory Visit: Payer: Self-pay

## 2018-05-21 ENCOUNTER — Ambulatory Visit (HOSPITAL_BASED_OUTPATIENT_CLINIC_OR_DEPARTMENT_OTHER): Payer: BLUE CROSS/BLUE SHIELD | Admitting: Anesthesiology

## 2018-05-21 ENCOUNTER — Encounter (HOSPITAL_BASED_OUTPATIENT_CLINIC_OR_DEPARTMENT_OTHER)
Admission: RE | Disposition: A | Discharge status: Home / Self Care | Payer: Self-pay | Source: Home / Self Care | Attending: Orthopedic Surgery

## 2018-05-21 ENCOUNTER — Encounter (HOSPITAL_BASED_OUTPATIENT_CLINIC_OR_DEPARTMENT_OTHER): Payer: Self-pay | Admitting: Anesthesiology

## 2018-05-21 ENCOUNTER — Ambulatory Visit (HOSPITAL_BASED_OUTPATIENT_CLINIC_OR_DEPARTMENT_OTHER)
Admission: RE | Admit: 2018-05-21 | Discharge: 2018-05-21 | Disposition: A | Discharge status: Home / Self Care | Payer: BLUE CROSS/BLUE SHIELD | Attending: Orthopedic Surgery | Admitting: Orthopedic Surgery

## 2018-05-21 DIAGNOSIS — G35 Multiple sclerosis: Secondary | ICD-10-CM | POA: Insufficient documentation

## 2018-05-21 DIAGNOSIS — W458XXA Other foreign body or object entering through skin, initial encounter: Secondary | ICD-10-CM | POA: Diagnosis not present

## 2018-05-21 DIAGNOSIS — I10 Essential (primary) hypertension: Secondary | ICD-10-CM | POA: Diagnosis not present

## 2018-05-21 DIAGNOSIS — F17210 Nicotine dependence, cigarettes, uncomplicated: Secondary | ICD-10-CM | POA: Insufficient documentation

## 2018-05-21 DIAGNOSIS — S60351A Superficial foreign body of right thumb, initial encounter: Secondary | ICD-10-CM | POA: Insufficient documentation

## 2018-05-21 DIAGNOSIS — L03011 Cellulitis of right finger: Secondary | ICD-10-CM | POA: Insufficient documentation

## 2018-05-21 DIAGNOSIS — S61300A Unspecified open wound of right index finger with damage to nail, initial encounter: Secondary | ICD-10-CM | POA: Diagnosis not present

## 2018-05-21 DIAGNOSIS — L0889 Other specified local infections of the skin and subcutaneous tissue: Secondary | ICD-10-CM | POA: Diagnosis not present

## 2018-05-21 HISTORY — DX: Essential (primary) hypertension: I10

## 2018-05-21 HISTORY — DX: Other visual disturbances: H53.8

## 2018-05-21 HISTORY — DX: Other specified postprocedural states: R11.2

## 2018-05-21 HISTORY — DX: Other specified postprocedural states: Z98.890

## 2018-05-21 HISTORY — PX: INCISION AND DRAINAGE: SHX5863

## 2018-05-21 LAB — POCT PREGNANCY, URINE: Preg Test, Ur: NEGATIVE

## 2018-05-21 SURGERY — INCISION AND DRAINAGE
Anesthesia: General | Site: Hand | Laterality: Right

## 2018-05-21 MED ORDER — ONDANSETRON HCL 4 MG/2ML IJ SOLN
INTRAMUSCULAR | Status: DC | PRN
  Administered 2018-05-21: 4 mg via INTRAVENOUS

## 2018-05-21 MED ORDER — ONDANSETRON HCL 4 MG/2ML IJ SOLN
INTRAMUSCULAR | Status: AC
  Filled 2018-05-21: qty 2

## 2018-05-21 MED ORDER — LIDOCAINE HCL (CARDIAC) PF 100 MG/5ML IV SOSY
PREFILLED_SYRINGE | INTRAVENOUS | Status: DC | PRN
  Administered 2018-05-21: 50 mg via INTRAVENOUS

## 2018-05-21 MED ORDER — CHLORHEXIDINE GLUCONATE 4 % EX LIQD: 60.0000 mL | Freq: Once | CUTANEOUS | Status: DC

## 2018-05-21 MED ORDER — LACTATED RINGERS IV SOLN: INTRAVENOUS | Status: DC

## 2018-05-21 MED ORDER — OXYCODONE HCL 5 MG PO TABS: 5.0000 mg | ORAL_TABLET | Freq: Once | ORAL | Status: DC | PRN

## 2018-05-21 MED ORDER — TRAMADOL HCL 50 MG PO TABS: 50.0000 mg | ORAL_TABLET | Freq: Four times a day (QID) | ORAL | 0 refills | Status: DC | PRN

## 2018-05-21 MED ORDER — FENTANYL CITRATE (PF) 100 MCG/2ML IJ SOLN: 25.0000 ug | INTRAMUSCULAR | Status: DC | PRN

## 2018-05-21 MED ORDER — MIDAZOLAM HCL 2 MG/2ML IJ SOLN
1.0000 mg | INTRAMUSCULAR | Status: DC | PRN
  Administered 2018-05-21: 2 mg via INTRAVENOUS

## 2018-05-21 MED ORDER — FENTANYL CITRATE (PF) 100 MCG/2ML IJ SOLN
INTRAMUSCULAR | Status: AC
  Filled 2018-05-21: qty 2

## 2018-05-21 MED ORDER — FENTANYL CITRATE (PF) 100 MCG/2ML IJ SOLN
50.0000 ug | INTRAMUSCULAR | Status: DC | PRN
  Administered 2018-05-21: 100 ug via INTRAVENOUS

## 2018-05-21 MED ORDER — SCOPOLAMINE 1 MG/3DAYS TD PT72: 1.0000 | MEDICATED_PATCH | Freq: Once | TRANSDERMAL | Status: DC | PRN

## 2018-05-21 MED ORDER — MIDAZOLAM HCL 2 MG/2ML IJ SOLN
INTRAMUSCULAR | Status: AC
  Filled 2018-05-21: qty 2

## 2018-05-21 MED ORDER — VANCOMYCIN HCL 1000 MG IV SOLR
INTRAVENOUS | Status: DC | PRN
  Administered 2018-05-21: 1000 mg via INTRAVENOUS

## 2018-05-21 MED ORDER — PROPOFOL 10 MG/ML IV BOLUS
INTRAVENOUS | Status: DC | PRN
  Administered 2018-05-21: 200 mg via INTRAVENOUS

## 2018-05-21 MED ORDER — LIDOCAINE 2% (20 MG/ML) 5 ML SYRINGE
INTRAMUSCULAR | Status: AC
  Filled 2018-05-21: qty 5

## 2018-05-21 MED ORDER — ONDANSETRON HCL 4 MG/2ML IJ SOLN: 4.0000 mg | Freq: Once | INTRAMUSCULAR | Status: DC | PRN

## 2018-05-21 MED ORDER — BUPIVACAINE HCL (PF) 0.25 % IJ SOLN
INTRAMUSCULAR | Status: DC | PRN
  Administered 2018-05-21: 9 mL

## 2018-05-21 MED ORDER — DEXAMETHASONE SODIUM PHOSPHATE 10 MG/ML IJ SOLN
INTRAMUSCULAR | Status: DC | PRN
  Administered 2018-05-21: 10 mg via INTRAVENOUS

## 2018-05-21 MED ORDER — VANCOMYCIN HCL IN DEXTROSE 1-5 GM/200ML-% IV SOLN
INTRAVENOUS | Status: AC
  Filled 2018-05-21: qty 200

## 2018-05-21 MED ORDER — BUPIVACAINE HCL (PF) 0.25 % IJ SOLN
INTRAMUSCULAR | Status: AC
  Filled 2018-05-21: qty 30

## 2018-05-21 MED ORDER — OXYCODONE HCL 5 MG/5ML PO SOLN: 5.0000 mg | Freq: Once | ORAL | Status: DC | PRN

## 2018-05-21 SURGICAL SUPPLY — 44 items
BAG DECANTER FOR FLEXI CONT (MISCELLANEOUS) IMPLANT
BLADE MINI RND TIP GREEN BEAV (BLADE) IMPLANT
BLADE SURG 15 STRL LF DISP TIS (BLADE) ×1 IMPLANT
BLADE SURG 15 STRL SS (BLADE) ×1
BNDG COHESIVE 1X5 TAN STRL LF (GAUZE/BANDAGES/DRESSINGS) IMPLANT
BNDG COHESIVE 3X5 TAN STRL LF (GAUZE/BANDAGES/DRESSINGS) IMPLANT
BNDG ESMARK 4X9 LF (GAUZE/BANDAGES/DRESSINGS) IMPLANT
BNDG GAUZE ELAST 4 BULKY (GAUZE/BANDAGES/DRESSINGS) IMPLANT
CHLORAPREP W/TINT 26 (MISCELLANEOUS) ×2 IMPLANT
CORD BIPOLAR FORCEPS 12FT (ELECTRODE) ×2 IMPLANT
COVER BACK TABLE 60X90IN (DRAPES) ×2 IMPLANT
COVER MAYO STAND STRL (DRAPES) ×2 IMPLANT
COVER WAND RF STERILE (DRAPES) IMPLANT
CUFF TOURN SGL QUICK 18X4 (TOURNIQUET CUFF) IMPLANT
DRAPE EXTREMITY T 121X128X90 (DISPOSABLE) ×2 IMPLANT
DRAPE SURG 17X23 STRL (DRAPES) ×2 IMPLANT
GAUZE PACKING IODOFORM 1/4X15 (GAUZE/BANDAGES/DRESSINGS) IMPLANT
GAUZE SPONGE 4X4 12PLY STRL (GAUZE/BANDAGES/DRESSINGS) ×2 IMPLANT
GAUZE XEROFORM 1X8 LF (GAUZE/BANDAGES/DRESSINGS) ×2 IMPLANT
GLOVE BIOGEL PI IND STRL 8.5 (GLOVE) ×1 IMPLANT
GLOVE BIOGEL PI INDICATOR 8.5 (GLOVE) ×1
GLOVE SURG ORTHO 8.0 STRL STRW (GLOVE) ×2 IMPLANT
GOWN STRL REUS W/ TWL LRG LVL3 (GOWN DISPOSABLE) ×1 IMPLANT
GOWN STRL REUS W/TWL LRG LVL3 (GOWN DISPOSABLE) ×1
GOWN STRL REUS W/TWL XL LVL3 (GOWN DISPOSABLE) ×2 IMPLANT
LOOP VESSEL MAXI BLUE (MISCELLANEOUS) IMPLANT
NEEDLE PRECISIONGLIDE 27X1.5 (NEEDLE) IMPLANT
NS IRRIG 1000ML POUR BTL (IV SOLUTION) ×2 IMPLANT
PACK BASIN DAY SURGERY FS (CUSTOM PROCEDURE TRAY) ×2 IMPLANT
PAD CAST 3X4 CTTN HI CHSV (CAST SUPPLIES) IMPLANT
PADDING CAST ABS 4INX4YD NS (CAST SUPPLIES) ×1
PADDING CAST ABS COTTON 4X4 ST (CAST SUPPLIES) ×1 IMPLANT
PADDING CAST COTTON 3X4 STRL (CAST SUPPLIES)
SPLINT PLASTER CAST XFAST 3X15 (CAST SUPPLIES) IMPLANT
SPLINT PLASTER XTRA FASTSET 3X (CAST SUPPLIES)
STOCKINETTE 4X48 STRL (DRAPES) ×2 IMPLANT
SUT ETHILON 4 0 PS 2 18 (SUTURE) ×2 IMPLANT
SWAB COLLECTION DEVICE MRSA (MISCELLANEOUS) IMPLANT
SWAB CULTURE ESWAB REG 1ML (MISCELLANEOUS) IMPLANT
SYR BULB 3OZ (MISCELLANEOUS) ×2 IMPLANT
SYR CONTROL 10ML LL (SYRINGE) IMPLANT
TOWEL GREEN STERILE FF (TOWEL DISPOSABLE) ×4 IMPLANT
TUBE FEEDING ENTERAL 5FR 16IN (TUBING) IMPLANT
UNDERPAD 30X30 (UNDERPADS AND DIAPERS) ×2 IMPLANT

## 2018-05-21 NOTE — Op Note (Signed)
NAME: Angela Hull MEDICAL RECORD NO: [id number] DATE OF BIRTH: 06-Feb-1976 FACILITY: [facility] LOCATION: [facility] PHYSICIAN: Nicki Reaper, MD   OPERATIVE REPORT   DATE OF PROCEDURE: 05/21/18    PREOPERATIVE DIAGNOSIS:   Infection and felon index finger right hand with foreign body right thumb   POSTOPERATIVE DIAGNOSIS:   Same   PROCEDURE:   Removal foreign body under the nail right thumb with partial nail root removal and I&D felon index finger right hand   SURGEON: Cindee Salt, M.D.   ASSISTANT: none   ANESTHESIA:  General and Local   INTRAVENOUS FLUIDS:  Per anesthesia flow sheet.   ESTIMATED BLOOD LOSS:  Minimal.   COMPLICATIONS:  None.   SPECIMENS:   Cultures   TOURNIQUET TIME:    Total Tourniquet Time Documented: Upper Arm (Right) - 11 minutes Total: Upper Arm (Right) - 11 minutes    DISPOSITION:  Stable to PACU.   INDICATIONS: Patient is a 43 year old female who sustained an injury to the tip of her index finger right hand.  She has had several course of antibiotics with continued pain swelling color changes to the tip of her index finger and a recent injury to her thumb with a foreign body under the nail.  She is desirous of treating each problems.  What we have recommended incision and drainage index with removal of foreign body the thumb.  Pre-peri-and postoperative course been discussed along with risk complications.  She is aware there is no guarantee to the surgery possibility of infection recurrence injury to arteries nerves tendons that is being done for cultures to determine antibiotic treatment  OPERATIVE COURSE: Patient is brought to the operating room where general anesthetic was carried out without difficulty.  She was prepped using ChloraPrep in the supine position with the right arm free.  A three-minute dry time was allowed timeout taken confirming patient procedure.  The limb was exsanguinated with an Esmarch bandage from the wrist  proximal.  A tourniquet placed on the arm was inflated to 250 mmHg.  A metacarpal block was given to both index middle finger 10 cc use of quarter percent bupivacaine without epinephrine.  The thumb was approached first.  A freer elevator was then used to elevate the nail plate distally.  This was removed with a large scissors.  Foreign body was immediately apparent and removed.  No  purulence was noted.  The index finger was attended to next.  The necrotic tissue was removed from the tip of the finger the distal portion of the nail plate removed with the scissors.  Purulent material was immediately encountered.  An incision was then made in the lateral aspect of the digit.  This was then deepened cutting the fibrous tissue in the pulp.  Shoes were taken for both aerobic anaerobic fungal and AFB cultures.  These were sent to pathology.  The area was copiously irrigated with saline.  The wound was packed with iodoform gauze.  Sterile compressive dressing and AlumaFoam splint placed to each finger.  Patient tolerated the procedure well was taken to the recovery room for observation in satisfactory condition.  She will be discharged home to return the [facility] [place] in 4 days Septra DS and Tylenol with Profen and Ultram as a backup.   Cindee Salt, MD Electronically signed, 05/21/18

## 2018-05-21 NOTE — Discharge Instructions (Signed)

## 2018-05-21 NOTE — Brief Op Note (Signed)
05/21/2018  10:08 AM  PATIENT:  Angela Hull  43 y.o. female  PRE-OPERATIVE DIAGNOSIS:  INFECTION RIGHT INDEX FINGER AND THUMB  POST-OPERATIVE DIAGNOSIS:  INFECTION RIGHT INDEX FINGER AND THUMB  PROCEDURE:  Procedure(s) with comments: INCISION AND DRAINAGE OF RIGHT INDEX FINGER AND THUMB (Right) - FAB  SURGEON:  Surgeon(s) and Role:    * Angela Salt, MD - Primary  PHYSICIAN ASSISTANT:   ASSISTANTS: none   ANESTHESIA:   local and general  EBL:  75ml   BLOOD ADMINISTERED:none  DRAINS: none and packing index   LOCAL MEDICATIONS USED:  BUPIVICAINE   SPECIMEN:  Source of Specimen:  Cultures  DISPOSITION OF SPECIMEN:  PATHOLOGY  COUNTS:  YES  TOURNIQUET:   Total Tourniquet Time Documented: Upper Arm (Right) - 11 minutes Total: Upper Arm (Right) - 11 minutes   DICTATION: .[name] Dictation  PLAN OF CARE: Discharge to home after PACU  PATIENT DISPOSITION:  PACU - hemodynamically stable.   \

## 2018-05-21 NOTE — Transfer of Care (Signed)
Immediate Anesthesia Transfer of Care Note  Patient: Angela Hull  Procedure(s) Performed: INCISION AND DRAINAGE OF RIGHT INDEX FINGER AND THUMB (Right Hand)  Patient Location: PACU  Anesthesia Type:General  Level of Consciousness: awake, oriented and patient cooperative  Airway & Oxygen Therapy: Patient Spontanous Breathing  Post-op Assessment: Report given to RN and Post -op Vital signs reviewed and stable  Post vital signs: Reviewed and stable  Last Vitals:  Vitals Value Taken Time  BP    Temp    Pulse    Resp    SpO2      Last Pain:  Vitals:   05/21/18 0900  TempSrc:   PainSc: 0-No pain         Complications: No apparent anesthesia complications

## 2018-05-21 NOTE — Anesthesia Preprocedure Evaluation (Addendum)
Anesthesia Evaluation  Patient identified by MRN, date of birth, ID band Patient awake    Reviewed: Allergy & Precautions, NPO status , Patient's Chart, lab work & pertinent test results  History of Anesthesia Complications (+) PONVNegative for: history of anesthetic complications  Airway Mallampati: II  TM Distance: >3 FB Neck ROM: Full    Dental   Pulmonary Current Smoker,    Pulmonary exam normal        Cardiovascular hypertension, Normal cardiovascular exam     Neuro/Psych PSYCHIATRIC DISORDERS Anxiety Depression Bipolar Disorder negative neurological ROS     GI/Hepatic negative GI ROS, Neg liver ROS,   Endo/Other  negative endocrine ROS  Renal/GU negative Renal ROS  negative genitourinary   Musculoskeletal negative musculoskeletal ROS (+)   Abdominal   Peds  Hematology negative hematology ROS (+)   Anesthesia Other Findings   Reproductive/Obstetrics                            Anesthesia Physical Anesthesia Plan  ASA: II  Anesthesia Plan: General   Post-op Pain Management:    Induction: Intravenous  PONV Risk Score and Plan: 3 and Treatment may vary due to age or medical condition, Ondansetron, Dexamethasone and Midazolam  Airway Management Planned: LMA  Additional Equipment: None  Intra-op Plan:   Post-operative Plan: Extubation in OR  Informed Consent: I have reviewed the patients History and Physical, chart, labs and discussed the procedure including the risks, benefits and alternatives for the proposed anesthesia with the patient or authorized representative who has indicated his/her understanding and acceptance.       Plan Discussed with:   Anesthesia Plan Comments:        Anesthesia Quick Evaluation

## 2018-05-21 NOTE — H&P (Signed)
Angela Hull is an 43 y.o. female.   Chief Complaint: infection index and thumb HPI: Angela Hull is a 43 year old right-hand-dominant female referred by Gilford Silvius for consultation regarding she sustained to her right index fingertip 6 months ago. She states that she was cleaning a tabletop by and felt something went under her nail. She has had 2 series of antibiotics for infection to the tip of the finger. This has not resolved it for her. She has been on Septra. She states it is gotten somewhat worse over the past 2 days. No prior history of injury she complains of severe pain at the tip of the finger. He has no history of diet prior diabetes thyroid problems arthritis or gout. Family history is positive arthritis negative for the remainder. She is complaining of a splinter under her thumbnail on her right hand also   Past Medical History:  Diagnosis Date  . Blurred vision   . Manic depressive disorder ([facility])   . Multiple sclerosis ([facility])   . PONV (postoperative nausea and vomiting)     Past Surgical History:  Procedure Laterality Date  . CESAREAN SECTION    . SPINE SURGERY    . Uterine Ablation      Family History  Problem Relation Age of Onset  . Asthma Maternal Grandmother    Social History:  reports that she has been smoking cigarettes. She has been smoking about 0.50 packs per day. She has never used smokeless tobacco. She reports current alcohol use. She reports that she does not use drugs.  Allergies: No Known Allergies  No medications prior to admission.    No results found for this or any previous visit (from the past 48 hour(s)).  No results found.   Pertinent items are noted in HPI.  Height 5\' 7"  (1.702 m), weight 81.6 kg.  General appearance: alert, cooperative and appears stated age Head: Normocephalic, without obvious abnormality Neck: no JVD Resp: normal percussion bilaterally Cardio: regular rate and rhythm, S1, S2 normal, no murmur, click, rub or gallop GI:  soft, non-tender; bowel sounds normal; no masses,  no organomegaly Extremities: infection index and thumb Pulses: 2+ and symmetric Skin: Skin color, texture, turgor normal. No rashes or lesions Neurologic: Grossly normal Incision/Wound: INDEX  Assessment/Plan  Assessment:  1. Felon of finger of right hand    Plan: We will renew her Septra. We will schedule for I&D of each of the digits. She is advised these will be left open. Is that she should not work around food with these. Is scheduled as an outpatient under regional anesthesia right hand thumb and index finger.    Cindee Salt 05/21/2018, 5:28 AM

## 2018-05-21 NOTE — Anesthesia Postprocedure Evaluation (Signed)
Anesthesia Post Note  Patient: Engineer, building services  Procedure(s) Performed: INCISION AND DRAINAGE OF RIGHT INDEX FINGER AND THUMB (Right Hand)     Patient location during evaluation: PACU Anesthesia Type: General Level of consciousness: awake and alert Pain management: pain level controlled Vital Signs Assessment: post-procedure vital signs reviewed and stable Respiratory status: spontaneous breathing, nonlabored ventilation and respiratory function stable Cardiovascular status: blood pressure returned to baseline and stable Postop Assessment: no apparent nausea or vomiting Anesthetic complications: no    Last Vitals:  Vitals:   05/21/18 0848 05/21/18 0900  BP: (!) 177/97 (!) 148/90  Pulse: 67 71  Resp: 18 18  Temp: 36.5 C   SpO2: 99% 100%    Last Pain:  Vitals:   05/21/18 0900  TempSrc:   PainSc: 0-No pain                 Lucretia Kern

## 2018-05-24 LAB — ACID FAST SMEAR (AFB, MYCOBACTERIA): Acid Fast Smear: NEGATIVE

## 2018-05-24 LAB — ACID FAST SMEAR (AFB)

## 2018-05-25 DIAGNOSIS — L03011 Cellulitis of right finger: Secondary | ICD-10-CM | POA: Diagnosis not present

## 2018-05-25 DIAGNOSIS — M79644 Pain in right finger(s): Secondary | ICD-10-CM | POA: Diagnosis not present

## 2018-05-26 LAB — AEROBIC/ANAEROBIC CULTURE W GRAM STAIN (SURGICAL/DEEP WOUND): Culture: NORMAL

## 2018-05-26 NOTE — Brief Op Note (Signed)
05/21/2018  2:38 PM  PATIENT:  Angela Hull  43 y.o. female  PRE-OPERATIVE DIAGNOSIS:  INFECTION RIGHT INDEX FINGER AND THUMB  POST-OPERATIVE DIAGNOSIS:  INFECTION RIGHT INDEX FINGER AND THUMB  PROCEDURE:  Procedure(s) with comments: INCISION AND DRAINAGE OF RIGHT INDEX FINGER AND THUMB (Right) - FAB  SURGEON:  Surgeon(s) and Role:    * Cindee Salt, MD - Primary  PHYSICIAN ASSISTANT:   ASSISTANTS: none   ANESTHESIA:   local, regional and IV sedation  EBL:  35ml  BLOOD ADMINISTERED:none  DRAINS: none   LOCAL MEDICATIONS USED:  BUPIVICAINE   SPECIMEN:  Source of Specimen:  Cultures  DISPOSITION OF SPECIMEN:  PATHOLOGY  COUNTS:  YES  TOURNIQUET:   Total Tourniquet Time Documented: Upper Arm (Right) - 11 minutes Total: Upper Arm (Right) - 11 minutes   DICTATION: .[name] Dictation  PLAN OF CARE: Discharge to home after PACU  PATIENT DISPOSITION:  PACU - hemodynamically stable.

## 2018-05-27 ENCOUNTER — Encounter (HOSPITAL_BASED_OUTPATIENT_CLINIC_OR_DEPARTMENT_OTHER): Payer: Self-pay | Admitting: Orthopedic Surgery

## 2018-05-28 DIAGNOSIS — M79644 Pain in right finger(s): Secondary | ICD-10-CM | POA: Diagnosis not present

## 2018-05-28 DIAGNOSIS — L03011 Cellulitis of right finger: Secondary | ICD-10-CM | POA: Diagnosis not present

## 2018-06-01 DIAGNOSIS — L03011 Cellulitis of right finger: Secondary | ICD-10-CM | POA: Diagnosis not present

## 2018-06-01 DIAGNOSIS — M79644 Pain in right finger(s): Secondary | ICD-10-CM | POA: Diagnosis not present

## 2018-06-11 DIAGNOSIS — L03011 Cellulitis of right finger: Secondary | ICD-10-CM | POA: Diagnosis not present

## 2018-06-11 DIAGNOSIS — M795 Residual foreign body in soft tissue: Secondary | ICD-10-CM | POA: Diagnosis not present

## 2018-06-11 DIAGNOSIS — M79644 Pain in right finger(s): Secondary | ICD-10-CM | POA: Diagnosis not present

## 2018-06-15 DIAGNOSIS — M795 Residual foreign body in soft tissue: Secondary | ICD-10-CM | POA: Diagnosis not present

## 2018-06-15 DIAGNOSIS — L03011 Cellulitis of right finger: Secondary | ICD-10-CM | POA: Diagnosis not present

## 2018-06-15 DIAGNOSIS — M79644 Pain in right finger(s): Secondary | ICD-10-CM | POA: Diagnosis not present

## 2018-06-19 DIAGNOSIS — M795 Residual foreign body in soft tissue: Secondary | ICD-10-CM | POA: Diagnosis not present

## 2018-06-19 DIAGNOSIS — M79644 Pain in right finger(s): Secondary | ICD-10-CM | POA: Diagnosis not present

## 2018-06-19 DIAGNOSIS — L03011 Cellulitis of right finger: Secondary | ICD-10-CM | POA: Diagnosis not present

## 2018-06-19 LAB — FUNGUS CULTURE WITH STAIN

## 2018-06-19 LAB — FUNGAL ORGANISM REFLEX

## 2018-06-19 LAB — FUNGUS CULTURE RESULT

## 2018-06-22 ENCOUNTER — Telehealth: Payer: Self-pay | Admitting: Family Medicine

## 2018-06-22 DIAGNOSIS — M25641 Stiffness of right hand, not elsewhere classified: Secondary | ICD-10-CM | POA: Diagnosis not present

## 2018-06-22 DIAGNOSIS — L03011 Cellulitis of right finger: Secondary | ICD-10-CM | POA: Diagnosis not present

## 2018-06-22 DIAGNOSIS — M795 Residual foreign body in soft tissue: Secondary | ICD-10-CM | POA: Diagnosis not present

## 2018-06-22 NOTE — Telephone Encounter (Signed)
televisit made for 05/23/18

## 2018-06-23 ENCOUNTER — Ambulatory Visit (INDEPENDENT_AMBULATORY_CARE_PROVIDER_SITE_OTHER): Payer: BLUE CROSS/BLUE SHIELD | Admitting: Family Medicine

## 2018-06-23 ENCOUNTER — Other Ambulatory Visit: Payer: Self-pay

## 2018-06-23 ENCOUNTER — Encounter: Payer: Self-pay | Admitting: Family Medicine

## 2018-06-23 DIAGNOSIS — G4700 Insomnia, unspecified: Secondary | ICD-10-CM

## 2018-06-23 DIAGNOSIS — I10 Essential (primary) hypertension: Secondary | ICD-10-CM | POA: Diagnosis not present

## 2018-06-23 MED ORDER — TRAZODONE HCL 50 MG PO TABS: 25.0000 mg | ORAL_TABLET | Freq: Every evening | ORAL | 3 refills | Status: DC | PRN

## 2018-06-23 NOTE — Progress Notes (Signed)
Virtual Visit via telephone Note Due to COVID-19, visit is conducted virtually and was requested by patient.  I connected with Angela Hull on 06/23/18 at 1010 by telephone and verified that I am speaking with the correct person using two identifiers. Angela Hull is currently located at home and family is currently with them during visit. The provider, Kari Baars, FNP is located in their office at time of visit.  I discussed the limitations, risks, security and privacy concerns of performing an evaluation and management service by telephone and the availability of in person appointments. I also discussed with the patient that there may be a patient responsible charge related to this service. The patient expressed understanding and agreed to proceed.  Subjective:  Patient ID: Angela Hull, female    DOB: 1975/09/27, 43 y.o.   MRN: [id number]  Chief Complaint:  Insomnia   HPI: Angela Hull is a 43 y.o. female presenting on 06/23/2018 for Insomnia   1. Insomnia, unspecified type States she is in need of a refill on her trazodone. States she takes 25-50 mg 2-3 times per week. States she does not need this on a nightly basis. States when she does take the medications it works well. She denies daytime drowsiness or fatigue.   2. Essential hypertension Complaint with meds - Yes Checking BP at home ranging 130/70 Exercising Regularly - No Watching Salt intake - Yes Pertinent ROS:  Headache - No Chest pain - No Dyspnea - No Palpitations - No LE edema - No They report good compliance with medications and can restate their regimen by memory. No medication side effects.  BP Readings from Last 3 Encounters:  05/21/18 (!) 148/90  05/12/18 (!) 181/108  12/31/17 (!) 156/85    She is currently under the care of an orthopedic surgeon for the felon of the right index finger noted on 05/12/2018. Pt has had surgery and is undergoing wound care. She states her finger is much better and  improving on a weekly basis.    Relevant past medical, surgical, family, and social history reviewed and updated as indicated.  Allergies and medications reviewed and updated.   Past Medical History:  Diagnosis Date  . Blurred vision   . Hypertension   . Manic depressive disorder (HCC)   . Multiple sclerosis (HCC)   . PONV (postoperative nausea and vomiting)     Past Surgical History:  Procedure Laterality Date  . CESAREAN SECTION    . INCISION AND DRAINAGE Right 05/21/2018   Procedure: INCISION AND DRAINAGE OF RIGHT INDEX FINGER AND THUMB;  Surgeon: Cindee Salt, MD;  Location: [facility];  Service: Orthopedics;  Laterality: Right;  FAB  . SPINE SURGERY    . Uterine Ablation      Social History   Socioeconomic History  . Marital status: Married    Spouse name: Not on file  . Number of children: Not on file  . Years of education: Not on file  . Highest education level: Not on file  Occupational History  . Not on file  Social Needs  . Financial resource strain: Not on file  . Food insecurity:    Worry: Not on file    Inability: Not on file  . Transportation needs:    Medical: Not on file    Non-medical: Not on file  Tobacco Use  . Smoking status: Current Every Day Smoker    Packs/day: 0.50    Types: Cigarettes  . Smokeless tobacco: Never Used  Substance  and Sexual Activity  . Alcohol use: Yes    Comment: Occasional  . Drug use: No  . Sexual activity: Yes  Lifestyle  . Physical activity:    Days per week: Not on file    Minutes per session: Not on file  . Stress: Not on file  Relationships  . Social connections:    Talks on phone: Not on file    Gets together: Not on file    Attends religious service: Not on file    Active member of club or organization: Not on file    Attends meetings of clubs or organizations: Not on file    Relationship status: Not on file  . Intimate partner violence:    Fear of current or ex partner: Not on file     Emotionally abused: Not on file    Physically abused: Not on file    Forced sexual activity: Not on file  Other Topics Concern  . Not on file  Social History Narrative  . Not on file    Outpatient Encounter Medications as of 06/23/2018  Medication Sig  . amLODipine (NORVASC) 5 MG tablet Take 1 tablet (5 mg total) by mouth daily.  . citalopram (CELEXA) 40 MG tablet Take 1 tablet (40 mg total) by mouth daily.  Marland Kitchen. sulfamethoxazole-trimethoprim (BACTRIM,SEPTRA) 400-80 MG tablet Take 1 tablet by mouth 2 (two) times daily.  . traMADol (ULTRAM) 50 MG tablet Take 1 tablet (50 mg total) by mouth every 6 (six) hours as needed.  . traZODone (DESYREL) 50 MG tablet Take 0.5-1 tablets (25-50 mg total) by mouth at bedtime as needed for sleep.  . [DISCONTINUED] traZODone (DESYREL) 50 MG tablet Take 0.5-1 tablets (25-50 mg total) by mouth at bedtime as needed for sleep.   No facility-administered encounter medications on file as of 06/23/2018.     No Known Allergies  Review of Systems  Constitutional: Negative for activity change, diaphoresis, fatigue, fever and unexpected weight change.  Eyes: Negative for photophobia and visual disturbance.  Respiratory: Negative for cough, chest tightness and shortness of breath.   Cardiovascular: Negative for chest pain, palpitations and leg swelling.  Neurological: Negative for dizziness, tremors, seizures, syncope, facial asymmetry, speech difficulty, weakness, light-headedness, numbness and headaches.  Psychiatric/Behavioral: Positive for sleep disturbance. Negative for confusion.  All other systems reviewed and are negative.        Observations/Objective: No vital signs or physical exam, this was a telephone or virtual health encounter.  Pt alert and oriented, answers all questions appropriately, and able to speak in full sentences.    Assessment and Plan: Angela BoxBritta was seen today for insomnia.  Diagnoses and all orders for this visit:  Insomnia,  unspecified type Well managed with trazodone as needed. Sleep hygiene discussed. Continue below.  -     traZODone (DESYREL) 50 MG tablet; Take 0.5-1 tablets (25-50 mg total) by mouth at bedtime as needed for sleep.  Essential hypertension Continue medications as prescribed. Report any persistent highs or lows. Reevaluation in 3 months.     Follow Up Instructions: Return in about 3 months (around 09/22/2018) for HTN.    I discussed the assessment and treatment plan with the patient. The patient was provided an opportunity to ask questions and all were answered. The patient agreed with the plan and demonstrated an understanding of the instructions.   The patient was advised to call back or seek an in-person evaluation if the symptoms worsen or if the condition fails to improve as anticipated.  The above assessment and management plan was discussed with the patient. The patient verbalized understanding of and has agreed to the management plan. Patient is aware to call the clinic if symptoms persist or worsen. Patient is aware when to return to the clinic for a follow-up visit. Patient educated on when it is appropriate to go to the emergency department.    I provided 15 minutes of non-face-to-face time during this encounter. The call started at 1010. The call ended at 1025.   Kari Baars, FNP-C [facility] [address] [place], [place] [address] [phone]

## 2018-07-02 ENCOUNTER — Encounter (HOSPITAL_BASED_OUTPATIENT_CLINIC_OR_DEPARTMENT_OTHER): Payer: Self-pay | Admitting: Orthopedic Surgery

## 2018-07-07 LAB — ACID FAST CULTURE WITH REFLEXED SENSITIVITIES (MYCOBACTERIA): Acid Fast Culture: NEGATIVE

## 2018-07-13 DIAGNOSIS — L03011 Cellulitis of right finger: Secondary | ICD-10-CM | POA: Diagnosis not present

## 2018-07-21 ENCOUNTER — Telehealth: Payer: Self-pay | Admitting: *Deleted

## 2018-07-21 NOTE — Telephone Encounter (Signed)
Attempted to contact patient to schedule 3 month follow up in July. Mailbox full

## 2018-07-27 DIAGNOSIS — L03011 Cellulitis of right finger: Secondary | ICD-10-CM | POA: Diagnosis not present

## 2018-09-07 DIAGNOSIS — F314 Bipolar disorder, current episode depressed, severe, without psychotic features: Secondary | ICD-10-CM | POA: Diagnosis not present

## 2018-09-08 ENCOUNTER — Telehealth: Payer: Self-pay | Admitting: Family Medicine

## 2018-09-08 DIAGNOSIS — G4700 Insomnia, unspecified: Secondary | ICD-10-CM

## 2018-09-08 DIAGNOSIS — F411 Generalized anxiety disorder: Secondary | ICD-10-CM

## 2018-09-08 MED ORDER — TRAZODONE HCL 50 MG PO TABS: 25.0000 mg | ORAL_TABLET | Freq: Every evening | ORAL | 0 refills | Status: DC | PRN

## 2018-09-08 MED ORDER — CITALOPRAM HYDROBROMIDE 40 MG PO TABS: 40.0000 mg | ORAL_TABLET | Freq: Every day | ORAL | 0 refills | Status: DC

## 2018-09-08 NOTE — Telephone Encounter (Signed)
Yes, as long as she agrees to keep her upcoming appointment.

## 2018-09-08 NOTE — Telephone Encounter (Signed)
Is this ok to fill? Please review and advise

## 2018-09-08 NOTE — Telephone Encounter (Signed)
Meds sent.  Needs to keep follow up appointment.

## 2018-09-16 ENCOUNTER — Telehealth: Payer: Self-pay | Admitting: Family Medicine

## 2018-09-17 ENCOUNTER — Other Ambulatory Visit: Payer: Self-pay

## 2018-09-17 ENCOUNTER — Encounter: Payer: Self-pay | Admitting: Family Medicine

## 2018-09-17 ENCOUNTER — Ambulatory Visit (INDEPENDENT_AMBULATORY_CARE_PROVIDER_SITE_OTHER): Payer: BC Managed Care – PPO | Admitting: Family Medicine

## 2018-09-17 VITALS — BP 158/92 | HR 87 | Temp 97.4°F | Ht 67.0 in | Wt 190.0 lb

## 2018-09-17 DIAGNOSIS — R6889 Other general symptoms and signs: Secondary | ICD-10-CM | POA: Diagnosis not present

## 2018-09-17 DIAGNOSIS — F339 Major depressive disorder, recurrent, unspecified: Secondary | ICD-10-CM

## 2018-09-17 DIAGNOSIS — F411 Generalized anxiety disorder: Secondary | ICD-10-CM

## 2018-09-17 DIAGNOSIS — F5104 Psychophysiologic insomnia: Secondary | ICD-10-CM

## 2018-09-17 DIAGNOSIS — I10 Essential (primary) hypertension: Secondary | ICD-10-CM | POA: Diagnosis not present

## 2018-09-17 MED ORDER — BUSPIRONE HCL 7.5 MG PO TABS: 7.5000 mg | ORAL_TABLET | Freq: Three times a day (TID) | ORAL | 3 refills | Status: DC

## 2018-09-17 MED ORDER — TRAZODONE HCL 50 MG PO TABS: 25.0000 mg | ORAL_TABLET | Freq: Every evening | ORAL | 3 refills | Status: DC | PRN

## 2018-09-17 MED ORDER — HYDROCHLOROTHIAZIDE 25 MG PO TABS: 25.0000 mg | ORAL_TABLET | Freq: Every day | ORAL | 3 refills | Status: DC

## 2018-09-17 NOTE — Progress Notes (Signed)
Subjective:  Patient ID: Angela Hull, female    DOB: May 11, 1975, 43 y.o.   MRN: [id number]  Chief Complaint:  Medical Management of Chronic Issues, Anxiety, and Hypertension   HPI: Angela Hull is a 43 y.o. female presenting on 09/17/2018 for Medical Management of Chronic Issues, Anxiety, and Hypertension   1. Essential hypertension  Complaint with meds - Yes Checking BP at home ranging 180/90 Exercising Regularly - No Watching Salt intake - No Pertinent ROS:  Headache - No Chest pain - No Dyspnea - No Palpitations - No LE edema - Yes, minimal  They report good compliance with medications and can restate their regimen by memory. No medication side effects.  BP Readings from Last 3 Encounters:  09/17/18 (!) 158/92  05/21/18 (!) 148/90  05/12/18 (!) 181/108     2. Psychophysiological insomnia  Ongoing for several months to years. States she has not been taking her trazodone lately and feels she needs to restart this. States she has trouble falling asleep and staying asleep.    3. Generalized anxiety disorder  Increased over the last few weeks due to issues at home. States her daughter, who is 90, lives in the home and has significant mental health issues. States she stopped taking her medications and is not aggressive towards family. States she refuses to get help and this is causing a lot of stress in the home. Pt reports her anxiety has increased greatly in the last few weeks. She denies SI or HI. States she is just very anxious and stressed.  GAD 7 : Generalized Anxiety Score 09/17/2018 05/12/2018 06/23/2017 12/26/2016  Nervous, Anxious, on Edge '3 1 3 '$ 0  Control/stop worrying '3 1 3 '$ 0  Worry too much - different things '3 1 3 '$ 0  Trouble relaxing '3 1 3 '$ 0  Restless 3 0 2 0  Easily annoyed or irritable 1 0 0 0  Afraid - awful might happen 3 0 0 0  Total GAD 7 Score '19 4 14 '$ 0  Anxiety Difficulty - Not difficult at all Somewhat difficult -     4. Depression, recurrent ([place])    Pt has had increased depression due to family issues. Daughter has significant mental health problems and refuses to take medications or seek care. Pt states she has been dealing with this for a long time but is has become worse over the last few months. Pt has started to see a therapist and feels this is helping. She has an appointment tomorrow.  Depression screen [facility] 2/9 09/17/2018 05/12/2018 12/31/2017 12/23/2017 11/26/2017  Decreased Interest '3 1 1 1 '$ 0  Down, Depressed, Hopeless '3 1 1 '$ 0 0  PHQ - 2 Score '6 2 2 1 '$ 0  Altered sleeping 3 0 1 - -  Tired, decreased energy 3 0 1 - -  Change in appetite 3 0 0 - -  Feeling bad or failure about yourself  '3 1 1 '$ - -  Trouble concentrating 3 1 0 - -  Moving slowly or fidgety/restless 3 0 0 - -  Suicidal thoughts 3 0 0 - -  PHQ-9 Score '27 4 5 '$ - -  Difficult doing work/chores - - - - -        Relevant past medical, surgical, family, and social history reviewed and updated as indicated.  Allergies and medications reviewed and updated.   Past Medical History:  Diagnosis Date   Blurred vision    Hypertension    Manic depressive disorder ([place])  Multiple sclerosis (HCC)    PONV (postoperative nausea and vomiting)     Past Surgical History:  Procedure Laterality Date   CESAREAN SECTION     INCISION AND DRAINAGE Right 05/21/2018   Procedure: INCISION AND DRAINAGE OF RIGHT INDEX FINGER AND THUMB;  Surgeon: Daryll Brod, MD;  Location: [facility];  Service: Orthopedics;  Laterality: Right;  FAB   SPINE SURGERY     Uterine Ablation      Social History   Socioeconomic History   Marital status: Married    Spouse name: Not on file   Number of children: Not on file   Years of education: Not on file   Highest education level: Not on file  Occupational History   Not on file  Social Needs   Financial resource strain: Not on file   Food insecurity    Worry: Not on file    Inability: Not on file   Transportation  needs    Medical: Not on file    Non-medical: Not on file  Tobacco Use   Smoking status: Current Every Day Smoker    Packs/day: 0.50    Types: Cigarettes   Smokeless tobacco: Never Used  Substance and Sexual Activity   Alcohol use: Yes    Comment: Occasional   Drug use: No   Sexual activity: Yes  Lifestyle   Physical activity    Days per week: Not on file    Minutes per session: Not on file   Stress: Not on file  Relationships   Social connections    Talks on phone: Not on file    Gets together: Not on file    Attends religious service: Not on file    Active member of club or organization: Not on file    Attends meetings of clubs or organizations: Not on file    Relationship status: Not on file   Intimate partner violence    Fear of current or ex partner: Not on file    Emotionally abused: Not on file    Physically abused: Not on file    Forced sexual activity: Not on file  Other Topics Concern   Not on file  Social History Narrative   Not on file    Outpatient Encounter Medications as of 09/17/2018  Medication Sig   amLODipine (NORVASC) 5 MG tablet Take 1 tablet (5 mg total) by mouth daily.   citalopram (CELEXA) 40 MG tablet Take 1 tablet (40 mg total) by mouth daily.   busPIRone (BUSPAR) 7.5 MG tablet Take 1 tablet (7.5 mg total) by mouth 3 (three) times daily.   hydrochlorothiazide (HYDRODIURIL) 25 MG tablet Take 1 tablet (25 mg total) by mouth daily.   traZODone (DESYREL) 50 MG tablet Take 0.5-1 tablets (25-50 mg total) by mouth at bedtime as needed for sleep.   [DISCONTINUED] sulfamethoxazole-trimethoprim (BACTRIM,SEPTRA) 400-80 MG tablet Take 1 tablet by mouth 2 (two) times daily.   [DISCONTINUED] traMADol (ULTRAM) 50 MG tablet Take 1 tablet (50 mg total) by mouth every 6 (six) hours as needed.   [DISCONTINUED] traZODone (DESYREL) 50 MG tablet Take 0.5-1 tablets (25-50 mg total) by mouth at bedtime as needed for sleep. (Patient not taking: Reported  on 09/17/2018)   No facility-administered encounter medications on file as of 09/17/2018.     No Known Allergies  Review of Systems  Constitutional: Positive for appetite change. Negative for activity change, chills, diaphoresis, fatigue, fever and unexpected weight change.  Eyes: Negative for photophobia and  visual disturbance.  Respiratory: Negative for cough, chest tightness, shortness of breath and wheezing.   Cardiovascular: Positive for leg swelling. Negative for chest pain and palpitations.  Gastrointestinal: Negative for abdominal pain, constipation, diarrhea, nausea and vomiting.  Endocrine: Negative for cold intolerance, heat intolerance, polydipsia, polyphagia and polyuria.  Genitourinary: Negative for decreased urine volume, difficulty urinating, vaginal bleeding, vaginal discharge and vaginal pain.  Neurological: Negative for dizziness, tremors, seizures, syncope, facial asymmetry, speech difficulty, weakness, light-headedness, numbness and headaches.  Hematological: Does not bruise/bleed easily.  Psychiatric/Behavioral: Positive for agitation, behavioral problems, decreased concentration, dysphoric mood and sleep disturbance. Negative for confusion, hallucinations, self-injury and suicidal ideas. The patient is nervous/anxious. The patient is not hyperactive.   All other systems reviewed and are negative.       Objective:  BP (!) 158/92 (Cuff Size: Normal)    Pulse 87    Temp (!) 97.4 F (36.3 C) (Oral)    Ht '5\' 7"'$  (1.702 m)    Wt 190 lb (86.2 kg)    BMI 29.76 kg/m    Wt Readings from Last 3 Encounters:  09/17/18 190 lb (86.2 kg)  05/21/18 180 lb 5.4 oz (81.8 kg)  05/12/18 183 lb (83 kg)    Physical Exam Vitals signs and nursing note reviewed.  Constitutional:      General: She is not in acute distress.    Appearance: Normal appearance. She is well-developed and well-groomed. She is not ill-appearing, toxic-appearing or diaphoretic.  HENT:     Head: Normocephalic and  atraumatic.     Jaw: There is normal jaw occlusion.     Right Ear: Hearing normal.     Left Ear: Hearing normal.     Nose: Nose normal.     Mouth/Throat:     Lips: Pink.     Mouth: Mucous membranes are moist.     Pharynx: Oropharynx is clear. Uvula midline.  Eyes:     General: Lids are normal.     Extraocular Movements: Extraocular movements intact.     Conjunctiva/sclera: Conjunctivae normal.     Pupils: Pupils are equal, round, and reactive to light.  Neck:     Musculoskeletal: Normal range of motion and neck supple.     Thyroid: No thyroid mass, thyromegaly or thyroid tenderness.     Vascular: No carotid bruit or JVD.     Trachea: Trachea and phonation normal.  Cardiovascular:     Rate and Rhythm: Normal rate and regular rhythm.     Chest Wall: PMI is not displaced.     Pulses: Normal pulses.     Heart sounds: Normal heart sounds. No murmur. No friction rub. No gallop.   Pulmonary:     Effort: Pulmonary effort is normal. No respiratory distress.     Breath sounds: Normal breath sounds. No wheezing.  Abdominal:     General: Bowel sounds are normal. There is no distension or abdominal bruit.     Palpations: Abdomen is soft. There is no hepatomegaly or splenomegaly.     Tenderness: There is no abdominal tenderness. There is no right CVA tenderness or left CVA tenderness.     Hernia: No hernia is present.  Musculoskeletal: Normal range of motion.     Right lower leg: 1+ Edema present.     Left lower leg: 1+ Edema present.  Lymphadenopathy:     Cervical: No cervical adenopathy.  Skin:    General: Skin is warm and dry.     Capillary Refill: Capillary refill takes  less than 2 seconds.     Coloration: Skin is not cyanotic, jaundiced or pale.     Findings: No rash.  Neurological:     General: No focal deficit present.     Mental Status: She is alert and oriented to person, place, and time.     Cranial Nerves: Cranial nerves are intact.     Sensory: Sensation is intact.      Motor: Motor function is intact.     Coordination: Coordination is intact.     Gait: Gait is intact.     Deep Tendon Reflexes: Reflexes are normal and symmetric.  Psychiatric:        Attention and Perception: Attention and perception normal.        Mood and Affect: Mood is anxious and depressed. Affect is tearful.        Speech: Speech normal.        Behavior: Behavior normal. Behavior is cooperative.        Thought Content: Thought content normal. Thought content does not include homicidal or suicidal ideation. Thought content does not include homicidal or suicidal plan.        Cognition and Memory: Cognition and memory normal.        Judgment: Judgment normal.     Results for orders placed or performed during the hospital encounter of 05/21/18  Fungus Culture With Stain   Specimen: Other; Wound  Result Value Ref Range   Fungus Stain Final report    Fungus (Mycology) Culture Final report    Fungal Source WOUND   Aerobic/Anaerobic Culture (surgical/deep wound)   Specimen: Other; Wound  Result Value Ref Range   Specimen Description WOUND RIGHT FINGER INDEX    Special Requests NONE    Gram Stain      FEW WBC PRESENT, PREDOMINANTLY PMN NO ORGANISMS SEEN    Culture      RARE NORMAL SKIN FLORA NO ANAEROBES ISOLATED Performed at [facility], [address], [place], [place] [address]    Report Status 05/26/2018 FINAL   Acid Fast Culture with reflexed sensitivities   Specimen: Other; Wound  Result Value Ref Range   Acid Fast Culture Negative    Source of Sample WOUND   Acid Fast Smear (AFB)   Specimen: Other; Wound  Result Value Ref Range   AFB Specimen Processing Concentration    Acid Fast Smear Negative    Source (AFB) WOUND   Fungus Culture Result  Result Value Ref Range   Result 1 Comment   Fungal organism reflex  Result Value Ref Range   Fungal result 1 Comment   Pregnancy, urine POC  Result Value Ref Range   Preg Test, Ur NEGATIVE NEGATIVE        Pertinent labs & imaging results that were available during my care of the patient were reviewed by me and considered in my medical decision making.  Assessment & Plan:  Diagnoses and all orders for this visit:  Essential hypertension BP remains elevated. Will initiate HCTZ. Pt to report any persistent high or low readings. Follow up in 2 weeks for repeat labs and BP check. DASH diet and exercise encouraged.  -     CMP14+EGFR -     CBC with Differential/Platelet -     Thyroid Panel With TSH -     hydrochlorothiazide (HYDRODIURIL) 25 MG tablet; Take 1 tablet (25 mg total) by mouth daily.  Psychophysiological insomnia Increased due to home situation. Will restart trazodone as needed.  Report any new or worsening symptoms. Sleep hygiene discussed.  -     traZODone (DESYREL) 50 MG tablet; Take 0.5-1 tablets (25-50 mg total) by mouth at bedtime as needed for sleep.  Generalized anxiety disorder Depression, recurrent ([place]) Stress management discussed. Pt will continue to see therapist. Will initiate Buspar as needed for anxiety. Follow up in 2 weeks for reevaluation.  -     Thyroid Panel With TSH -     busPIRone (BUSPAR) 7.5 MG tablet; Take 1 tablet (7.5 mg total) by mouth 3 (three) times daily.     Continue all other maintenance medications.  Follow up plan: Return in about 2 weeks (around 10/01/2018) for HTN, GAD, CMP.  Educational handout given for survey, depression   The above assessment and management plan was discussed with the patient. The patient verbalized understanding of and has agreed to the management plan. Patient is aware to call the clinic if symptoms persist or worsen. Patient is aware when to return to the clinic for a follow-up visit. Patient educated on when it is appropriate to go to the emergency department.   Monia Pouch, FNP-C [facility] [phone]

## 2018-09-17 NOTE — Patient Instructions (Addendum)
It was a pleasure seeing you today, Angela Hull.  Information regarding what we discussed is included in this packet.  Please make an appointment to see me in 2 weeks.   In a few days you may receive a survey in the mail or online from [employer] regarding your visit with [place] today. Please take a moment to fill this out. Your feedback is very important to our office. It can help [place] better understand your needs as well as improve your experience and satisfaction. Thank you for taking your time to complete it. We care about you.  If your symptoms worsen or you have thoughts of suicide/homicide, PLEASE SEEK IMMEDIATE MEDICAL ATTENTION.  You may always call the National Suicide Hotline.  This is available 24 hours a day, 7 days a week.  Their number is: (712)080-6031  Taking the medicine as directed and not missing any doses is one of the best things you can do to treat your depression.  Here are some things to keep in mind:  1) Side effects (stomach upset, some increased anxiety) may happen before you notice a benefit.  These side effects typically go away over time. 2) Changes to your dose of medicine or a change in medication all together is sometimes necessary 3) Most people need to be on medication at least 12 months 4) Many people will notice an improvement within two weeks but the full effect of the medication can take up to 4-6 weeks 5) Stopping the medication when you start feeling better often results in a return of symptoms 6) Never discontinue your medication without contacting a health care professional first.  Some medications require gradual discontinuation/ taper and can make you sick if you stop them abruptly.  If your symptoms worsen or you have thoughts of suicide/homicide, PLEASE SEEK IMMEDIATE MEDICAL ATTENTION.  You may always call:  National Suicide Hotline: 252-068-2986 [place]: (830)437-6843 [facility] in [place]: (253) 726-5753   These are available  24 hours a day, 7 days a week.    Because of recent events of COVID-19 ("Coronavirus"), please follow CDC recommendations:   1. Wash your hand frequently 2. Avoid touching your face 3. Stay away from people who are sick 4. If you have symptoms such as fever, cough, shortness of breath then call your healthcare provider for further guidance 5. If you are sick, STAY AT HOME, unless otherwise directed by your healthcare provider. 6. Follow directions from state and national officials regarding staying safe    Please feel free to call our office if any questions or concerns arise.  Warm Regards, Monia Pouch, FNP-C [facility] [address] [place], [place] [address] [phone]

## 2018-09-18 LAB — CBC WITH DIFFERENTIAL/PLATELET
Basophils Absolute: 0.1 10*3/uL (ref 0.0–0.2)
Basos: 1 %
EOS (ABSOLUTE): 0.6 10*3/uL — ABNORMAL HIGH (ref 0.0–0.4)
Eos: 6 %
Hematocrit: 42.6 % (ref 34.0–46.6)
Hemoglobin: 15.3 g/dL (ref 11.1–15.9)
Immature Grans (Abs): 0.1 10*3/uL (ref 0.0–0.1)
Immature Granulocytes: 1 %
Lymphocytes Absolute: 1.8 10*3/uL (ref 0.7–3.1)
Lymphs: 18 %
MCH: 36.3 pg — ABNORMAL HIGH (ref 26.6–33.0)
MCHC: 35.9 g/dL — ABNORMAL HIGH (ref 31.5–35.7)
MCV: 101 fL — ABNORMAL HIGH (ref 79–97)
Monocytes Absolute: 0.6 10*3/uL (ref 0.1–0.9)
Monocytes: 6 %
Neutrophils Absolute: 6.8 10*3/uL (ref 1.4–7.0)
Neutrophils: 68 %
Platelets: 327 10*3/uL (ref 150–450)
RBC: 4.21 x10E6/uL (ref 3.77–5.28)
RDW: 11.7 % (ref 11.7–15.4)
WBC: 10.1 10*3/uL (ref 3.4–10.8)

## 2018-09-18 LAB — CMP14+EGFR
ALT: 17 IU/L (ref 0–32)
AST: 18 IU/L (ref 0–40)
Albumin/Globulin Ratio: 2.2 (ref 1.2–2.2)
Albumin: 4.3 g/dL (ref 3.8–4.8)
Alkaline Phosphatase: 90 IU/L (ref 39–117)
BUN/Creatinine Ratio: 8 — ABNORMAL LOW (ref 9–23)
BUN: 6 mg/dL (ref 6–24)
Bilirubin Total: <0.2 mg/dL (ref 0.0–1.2)
CO2: 23 mmol/L (ref 20–29)
Calcium: 9.5 mg/dL (ref 8.7–10.2)
Chloride: 98 mmol/L (ref 96–106)
Creatinine, Ser: 0.71 mg/dL (ref 0.57–1.00)
GFR calc Af Amer: 122 mL/min/{1.73_m2} (ref >59)
GFR calc non Af Amer: 106 mL/min/{1.73_m2} (ref >59)
Globulin, Total: 2 g/dL (ref 1.5–4.5)
Glucose: 95 mg/dL (ref 65–99)
Potassium: 4.7 mmol/L (ref 3.5–5.2)
Sodium: 135 mmol/L (ref 134–144)
Total Protein: 6.3 g/dL (ref 6.0–8.5)

## 2018-09-18 LAB — THYROID PANEL WITH TSH
Free Thyroxine Index: 1.4 (ref 1.2–4.9)
T3 Uptake Ratio: 29 % (ref 24–39)
T4, Total: 4.7 ug/dL (ref 4.5–12.0)
TSH: 0.905 u[IU]/mL (ref 0.450–4.500)

## 2018-09-21 DIAGNOSIS — F314 Bipolar disorder, current episode depressed, severe, without psychotic features: Secondary | ICD-10-CM | POA: Diagnosis not present

## 2018-09-28 DIAGNOSIS — F314 Bipolar disorder, current episode depressed, severe, without psychotic features: Secondary | ICD-10-CM | POA: Diagnosis not present

## 2018-10-05 ENCOUNTER — Ambulatory Visit: Payer: BC Managed Care – PPO | Admitting: Family Medicine

## 2018-10-14 ENCOUNTER — Ambulatory Visit: Payer: BC Managed Care – PPO | Admitting: Family Medicine

## 2018-10-14 ENCOUNTER — Other Ambulatory Visit: Payer: Self-pay

## 2018-10-14 ENCOUNTER — Encounter: Payer: Self-pay | Admitting: Family Medicine

## 2018-10-14 VITALS — BP 150/90 | HR 78 | Temp 97.5°F | Ht 67.0 in | Wt 189.0 lb

## 2018-10-14 DIAGNOSIS — I10 Essential (primary) hypertension: Secondary | ICD-10-CM | POA: Diagnosis not present

## 2018-10-14 DIAGNOSIS — F411 Generalized anxiety disorder: Secondary | ICD-10-CM

## 2018-10-14 DIAGNOSIS — F339 Major depressive disorder, recurrent, unspecified: Secondary | ICD-10-CM

## 2018-10-14 MED ORDER — CITALOPRAM HYDROBROMIDE 40 MG PO TABS: 40.0000 mg | ORAL_TABLET | Freq: Every day | ORAL | 0 refills | Status: DC

## 2018-10-14 MED ORDER — AMLODIPINE BESYLATE 10 MG PO TABS: 10.0000 mg | ORAL_TABLET | Freq: Every day | ORAL | 3 refills | Status: DC

## 2018-10-14 MED ORDER — BUSPIRONE HCL 15 MG PO TABS: 15.0000 mg | ORAL_TABLET | Freq: Three times a day (TID) | ORAL | 1 refills | Status: DC

## 2018-10-14 NOTE — Progress Notes (Signed)
Subjective:  Patient ID: Angela Hull, female    DOB: [birth year], 43 y.o.   MRN: [id number]  Patient Care Team: Baruch Gouty, FNP as PCP - General (Family Medicine)   Chief Complaint:  Medical Management of Chronic Issues (2 week ), GAD, and Hypertension   HPI: Angela Hull is a 43 y.o. female presenting on 10/14/2018 for Medical Management of Chronic Issues (2 week ), GAD, and Hypertension   Pt following up today for GAD, depression, and HTN. She has been going to a therapist. States she has been three times with her husband and this has been helpful thus far.   Hypertension This is a chronic problem. The current episode started more than 1 year ago. The problem has been waxing and waning since onset. The problem is uncontrolled. Associated symptoms include anxiety and malaise/fatigue. Pertinent negatives include no blurred vision, chest pain, headaches, neck pain, orthopnea, palpitations, peripheral edema, PND, shortness of breath or sweats. Risk factors for coronary artery disease include stress, family history and smoking/tobacco exposure. Past treatments include diuretics and calcium channel blockers. The current treatment provides mild improvement. Compliance problems include exercise, psychosocial issues and diet.  There is no history of angina, kidney disease, CAD/MI, CVA, heart failure, left ventricular hypertrophy, PVD or retinopathy.  Depression      The patient presents with depression.  This is a recurrent problem.  The current episode started more than 1 year ago.   The problem occurs daily.  The problem has been gradually improving since onset.  Associated symptoms include decreased concentration, fatigue, helplessness, hopelessness, insomnia, irritable, restlessness, decreased interest, appetite change and sad.  Associated symptoms include no body aches, no myalgias, no headaches, no indigestion and no suicidal ideas.     The symptoms are aggravated by family issues and work  stress.  Past treatments include SSRIs - Selective serotonin reuptake inhibitors and other medications.  Compliance with treatment is good.  Previous treatment provided moderate relief.  Risk factors include history of mental illness, family history of mental illness, family history, major life event and stress.   Past medical history includes anxiety, depression and mental health disorder.   Anxiety Presents for follow-up visit. Symptoms include decreased concentration, depressed mood, excessive worry, insomnia, irritability, nervous/anxious behavior and restlessness. Patient reports no chest pain, compulsions, confusion, dizziness, dry mouth, feeling of choking, hyperventilation, impotence, malaise, muscle tension, nausea, obsessions, palpitations, panic, shortness of breath or suicidal ideas. Symptoms occur constantly. The severity of symptoms is severe, causing significant distress and interfering with daily activities. The quality of sleep is fair. Nighttime awakenings: one to two.   Her past medical history is significant for depression. Compliance with medications is 76-100%.   Depression screen [facility] 2/9 10/14/2018 09/17/2018 05/12/2018 12/31/2017 12/23/2017  Decreased Interest '1 3 1 1 1  '$ Down, Depressed, Hopeless '2 3 1 1 '$ 0  PHQ - 2 Score '3 6 2 2 1  '$ Altered sleeping 2 3 0 1 -  Tired, decreased energy 1 3 0 1 -  Change in appetite 1 3 0 0 -  Feeling bad or failure about yourself  '3 3 1 1 '$ -  Trouble concentrating '1 3 1 '$ 0 -  Moving slowly or fidgety/restless 0 3 0 0 -  Suicidal thoughts 0 3 0 0 -  PHQ-9 Score '11 27 4 5 '$ -  Difficult doing work/chores Very difficult - - - -   GAD 7 : Generalized Anxiety Score 09/17/2018 05/12/2018 06/23/2017 12/26/2016  Nervous, Anxious, on  Edge '3 1 3 '$ 0  Control/stop worrying '3 1 3 '$ 0  Worry too much - different things '3 1 3 '$ 0  Trouble relaxing '3 1 3 '$ 0  Restless 3 0 2 0  Easily annoyed or irritable 1 0 0 0  Afraid - awful might happen 3 0 0 0  Total GAD 7 Score '19 4  14 '$ 0  Anxiety Difficulty - Not difficult at all Somewhat difficult -      Relevant past medical, surgical, family, and social history reviewed and updated as indicated.  Allergies and medications reviewed and updated. Date reviewed: Chart in Epic.   Past Medical History:  Diagnosis Date  . Blurred vision   . Hypertension   . Manic depressive disorder ([place])   . Multiple sclerosis ([place])   . PONV (postoperative nausea and vomiting)     Past Surgical History:  Procedure Laterality Date  . CESAREAN SECTION    . INCISION AND DRAINAGE Right 05/21/2018   Procedure: INCISION AND DRAINAGE OF RIGHT INDEX FINGER AND THUMB;  Surgeon: Daryll Brod, MD;  Location: [facility];  Service: Orthopedics;  Laterality: Right;  FAB  . SPINE SURGERY    . Uterine Ablation      Social History   Socioeconomic History  . Marital status: Married    Spouse name: Not on file  . Number of children: Not on file  . Years of education: Not on file  . Highest education level: Not on file  Occupational History  . Not on file  Social Needs  . Financial resource strain: Not on file  . Food insecurity    Worry: Not on file    Inability: Not on file  . Transportation needs    Medical: Not on file    Non-medical: Not on file  Tobacco Use  . Smoking status: Current Every Day Smoker    Packs/day: 0.50    Types: Cigarettes  . Smokeless tobacco: Never Used  Substance and Sexual Activity  . Alcohol use: Yes    Comment: Occasional  . Drug use: No  . Sexual activity: Yes  Lifestyle  . Physical activity    Days per week: Not on file    Minutes per session: Not on file  . Stress: Not on file  Relationships  . Social Herbalist on phone: Not on file    Gets together: Not on file    Attends religious service: Not on file    Active member of club or organization: Not on file    Attends meetings of clubs or organizations: Not on file    Relationship status: Not on file  . Intimate  partner violence    Fear of current or ex partner: Not on file    Emotionally abused: Not on file    Physically abused: Not on file    Forced sexual activity: Not on file  Other Topics Concern  . Not on file  Social History Narrative  . Not on file    Outpatient Encounter Medications as of 10/14/2018  Medication Sig  . citalopram (CELEXA) 40 MG tablet Take 1 tablet (40 mg total) by mouth daily.  . hydrochlorothiazide (HYDRODIURIL) 25 MG tablet Take 1 tablet (25 mg total) by mouth daily.  . traZODone (DESYREL) 50 MG tablet Take 0.5-1 tablets (25-50 mg total) by mouth at bedtime as needed for sleep.  . [DISCONTINUED] amLODipine (NORVASC) 5 MG tablet Take 1 tablet (5 mg total) by mouth daily.  . [  DISCONTINUED] busPIRone (BUSPAR) 7.5 MG tablet Take 1 tablet (7.5 mg total) by mouth 3 (three) times daily.  . [DISCONTINUED] citalopram (CELEXA) 40 MG tablet Take 1 tablet (40 mg total) by mouth daily.  Marland Kitchen amLODipine (NORVASC) 10 MG tablet Take 1 tablet (10 mg total) by mouth daily.  . busPIRone (BUSPAR) 15 MG tablet Take 1 tablet (15 mg total) by mouth 3 (three) times daily.   No facility-administered encounter medications on file as of 10/14/2018.     No Known Allergies  Review of Systems  Constitutional: Positive for appetite change, fatigue, irritability and malaise/fatigue. Negative for activity change, chills, diaphoresis, fever and unexpected weight change.  Eyes: Negative for blurred vision, photophobia and visual disturbance.  Respiratory: Negative for cough, chest tightness, shortness of breath and wheezing.   Cardiovascular: Negative for chest pain, palpitations, orthopnea, leg swelling and PND.  Gastrointestinal: Negative for abdominal pain, constipation, diarrhea, nausea and vomiting.  Endocrine: Negative for cold intolerance, heat intolerance, polydipsia, polyphagia and polyuria.  Genitourinary: Negative for decreased urine volume, difficulty urinating and impotence.   Musculoskeletal: Negative for myalgias and neck pain.  Skin: Negative for color change and pallor.  Neurological: Negative for dizziness, tremors, seizures, syncope, facial asymmetry, speech difficulty, weakness, light-headedness, numbness and headaches.  Psychiatric/Behavioral: Positive for agitation, behavioral problems, decreased concentration, depression, dysphoric mood and sleep disturbance. Negative for confusion, hallucinations, self-injury and suicidal ideas. The patient is nervous/anxious and has insomnia. The patient is not hyperactive.   All other systems reviewed and are negative.       Objective:  BP (!) 150/90   Pulse 78   Temp (!) 97.5 F (36.4 C) (Oral)   Ht '5\' 7"'$  (1.702 m)   Wt 189 lb (85.7 kg)   BMI 29.60 kg/m    Wt Readings from Last 3 Encounters:  10/14/18 189 lb (85.7 kg)  09/17/18 190 lb (86.2 kg)  05/21/18 180 lb 5.4 oz (81.8 kg)    Physical Exam Vitals signs and nursing note reviewed.  Constitutional:      General: She is irritable. She is not in acute distress.    Appearance: Normal appearance. She is well-developed and well-groomed. She is not ill-appearing, toxic-appearing or diaphoretic.  HENT:     Head: Normocephalic and atraumatic.     Jaw: There is normal jaw occlusion.     Right Ear: Hearing normal.     Left Ear: Hearing normal.     Nose: Nose normal.     Mouth/Throat:     Lips: Pink.     Mouth: Mucous membranes are moist.     Pharynx: Oropharynx is clear. Uvula midline.  Eyes:     General: Lids are normal.     Extraocular Movements: Extraocular movements intact.     Conjunctiva/sclera: Conjunctivae normal.     Pupils: Pupils are equal, round, and reactive to light.  Neck:     Musculoskeletal: Normal range of motion and neck supple.     Thyroid: No thyroid mass, thyromegaly or thyroid tenderness.     Vascular: No carotid bruit or JVD.     Trachea: Trachea and phonation normal.  Cardiovascular:     Rate and Rhythm: Normal rate and  regular rhythm.     Chest Wall: PMI is not displaced.     Pulses: Normal pulses.     Heart sounds: Normal heart sounds. No murmur. No friction rub. No gallop.   Pulmonary:     Effort: Pulmonary effort is normal. No respiratory distress.  Breath sounds: Normal breath sounds. No wheezing.  Abdominal:     General: Bowel sounds are normal. There is no distension or abdominal bruit.     Palpations: Abdomen is soft. There is no hepatomegaly or splenomegaly.     Tenderness: There is no abdominal tenderness. There is no right CVA tenderness or left CVA tenderness.     Hernia: No hernia is present.  Musculoskeletal: Normal range of motion.     Right lower leg: No edema.     Left lower leg: No edema.  Lymphadenopathy:     Cervical: No cervical adenopathy.  Skin:    General: Skin is warm and dry.     Capillary Refill: Capillary refill takes less than 2 seconds.     Coloration: Skin is not cyanotic, jaundiced or pale.     Findings: No rash.  Neurological:     General: No focal deficit present.     Mental Status: She is alert and oriented to person, place, and time.     Cranial Nerves: Cranial nerves are intact.     Sensory: Sensation is intact.     Motor: Motor function is intact.     Coordination: Coordination is intact.     Gait: Gait is intact.     Deep Tendon Reflexes: Reflexes are normal and symmetric.  Psychiatric:        Attention and Perception: Attention and perception normal.        Mood and Affect: Mood is anxious and depressed. Affect is tearful.        Speech: Speech normal.        Behavior: Behavior normal. Behavior is cooperative.        Thought Content: Thought content normal.        Cognition and Memory: Cognition and memory normal.        Judgment: Judgment normal.     Results for orders placed or performed in visit on 09/17/18  CMP14+EGFR  Result Value Ref Range   Glucose 95 65 - 99 mg/dL   BUN 6 6 - 24 mg/dL   Creatinine, Ser 0.71 0.57 - 1.00 mg/dL   GFR  calc non Af Amer 106 >59 mL/min/1.73   GFR calc Af Amer 122 >59 mL/min/1.73   BUN/Creatinine Ratio 8 (L) 9 - 23   Sodium 135 134 - 144 mmol/L   Potassium 4.7 3.5 - 5.2 mmol/L   Chloride 98 96 - 106 mmol/L   CO2 23 20 - 29 mmol/L   Calcium 9.5 8.7 - 10.2 mg/dL   Total Protein 6.3 6.0 - 8.5 g/dL   Albumin 4.3 3.8 - 4.8 g/dL   Globulin, Total 2.0 1.5 - 4.5 g/dL   Albumin/Globulin Ratio 2.2 1.2 - 2.2   Bilirubin Total <0.2 0.0 - 1.2 mg/dL   Alkaline Phosphatase 90 39 - 117 IU/L   AST 18 0 - 40 IU/L   ALT 17 0 - 32 IU/L  CBC with Differential/Platelet  Result Value Ref Range   WBC 10.1 3.4 - 10.8 x10E3/uL   RBC 4.21 3.77 - 5.28 x10E6/uL   Hemoglobin 15.3 11.1 - 15.9 g/dL   Hematocrit 42.6 34.0 - 46.6 %   MCV 101 (H) 79 - 97 fL   MCH 36.3 (H) 26.6 - 33.0 pg   MCHC 35.9 (H) 31.5 - 35.7 g/dL   RDW 11.7 11.7 - 15.4 %   Platelets 327 150 - 450 x10E3/uL   Neutrophils 68 Not Estab. %   Lymphs 18 Not Estab. %  Monocytes 6 Not Estab. %   Eos 6 Not Estab. %   Basos 1 Not Estab. %   Neutrophils Absolute 6.8 1.4 - 7.0 x10E3/uL   Lymphocytes Absolute 1.8 0.7 - 3.1 x10E3/uL   Monocytes Absolute 0.6 0.1 - 0.9 x10E3/uL   EOS (ABSOLUTE) 0.6 (H) 0.0 - 0.4 x10E3/uL   Basophils Absolute 0.1 0.0 - 0.2 x10E3/uL   Immature Granulocytes 1 Not Estab. %   Immature Grans (Abs) 0.1 0.0 - 0.1 x10E3/uL  Thyroid Panel With TSH  Result Value Ref Range   TSH 0.905 0.450 - 4.500 uIU/mL   T4, Total 4.7 4.5 - 12.0 ug/dL   T3 Uptake Ratio 29 24 - 39 %   Free Thyroxine Index 1.4 1.2 - 4.9       Pertinent labs & imaging results that were available during my care of the patient were reviewed by me and considered in my medical decision making.  Assessment & Plan:  Angela Hull was seen today for medical management of chronic issues, gad and hypertension.  Diagnoses and all orders for this visit:  Essential hypertension BP poorly controlled. Changes were made in regimen today, increased amlodipine to 10 mg  daily. Daily blood pressure log given with instructions on how to fill out and told to bring to next visit. Gaol BP 130/80. Pt aware to report any persistent high or low readings. DASH diet and exercise encouraged. Exercise at least 150 minutes per week and increase as tolerated. Goal BMI > 25. Stress management encouraged. Smoking cessation discussed. Avoid excessive alcohol. Avoid NSAID's. Avoid more than 2000 mg of sodium daily. Medications as prescribed. Follow up as scheduled.  -     amLODipine (NORVASC) 10 MG tablet; Take 1 tablet (10 mg total) by mouth daily. -     CMP14+EGFR  Generalized anxiety disorder Depression, recurrent ([place]) Ongoing depression and anxiety. Is seeing a therapist on a regular basis. Will increase Buspar to 15 mg three times daily as needed. Report any new or worsening symptoms. Follow up in 3 months or sooner if needed.  -     busPIRone (BUSPAR) 15 MG tablet; Take 1 tablet (15 mg total) by mouth 3 (three) times daily. -     citalopram (CELEXA) 40 MG tablet; Take 1 tablet (40 mg total) by mouth daily.   Continue all other maintenance medications.  Follow up plan: Return in about 3 months (around 01/14/2019), or if symptoms worsen or fail to improve, for HTN, GAD, Depression.  Continue healthy lifestyle choices, including diet (rich in fruits, vegetables, and lean proteins, and low in salt and simple carbohydrates) and exercise (at least 30 minutes of moderate physical activity daily).  Educational handout given for DASH eating plan  The above assessment and management plan was discussed with the patient. The patient verbalized understanding of and has agreed to the management plan. Patient is aware to call the clinic if symptoms persist or worsen. Patient is aware when to return to the clinic for a follow-up visit. Patient educated on when it is appropriate to go to the emergency department.   Monia Pouch, FNP-C [facility] [phone]  10/14/18

## 2018-10-14 NOTE — Patient Instructions (Signed)
DASH Eating Plan DASH stands for "Dietary Approaches to Stop Hypertension." The DASH eating plan is a healthy eating plan that has been shown to reduce high blood pressure (hypertension). Additional health benefits may include reducing the risk of type 2 diabetes mellitus, heart disease, and stroke. The DASH eating plan may also help with weight loss.  WHAT DO I NEED TO KNOW ABOUT THE DASH EATING PLAN? For the DASH eating plan, you will follow these general guidelines:  Choose foods with a percent daily value for sodium of less than 5% (as listed on the food label).  Use salt-free seasonings or herbs instead of table salt or sea salt.  Check with your health care provider or pharmacist before using salt substitutes.  Eat lower-sodium products, often labeled as "lower sodium" or "no salt added."  Eat fresh foods.  Eat more vegetables, fruits, and low-fat dairy products.  Choose whole grains. Look for the word "whole" as the first word in the ingredient list.  Choose fish and skinless chicken or turkey more often than red meat. Limit fish, poultry, and meat to 6 oz (170 g) each day.  Limit sweets, desserts, sugars, and sugary drinks.  Choose heart-healthy fats.  Limit cheese to 1 oz (28 g) per day.  Eat more home-cooked food and less restaurant, buffet, and fast food.  Limit fried foods.  Cook foods using methods other than frying.  Limit canned vegetables. If you do use them, rinse them well to decrease the sodium.  When eating at a restaurant, ask that your food be prepared with less salt, or no salt if possible.  WHAT FOODS CAN I EAT? Seek help from a dietitian for individual calorie needs.  Grains Whole grain or whole wheat bread. Brown rice. Whole grain or whole wheat pasta. Quinoa, bulgur, and whole grain cereals. Low-sodium cereals. Corn or whole wheat flour tortillas. Whole grain cornbread. Whole grain crackers. Low-sodium crackers.  Vegetables Fresh or frozen  vegetables (raw, steamed, roasted, or grilled). Low-sodium or reduced-sodium tomato and vegetable juices. Low-sodium or reduced-sodium tomato sauce and paste. Low-sodium or reduced-sodium canned vegetables.   Fruits All fresh, canned (in natural juice), or frozen fruits.  Meat and Other Protein Products Ground beef (85% or leaner), grass-fed beef, or beef trimmed of fat. Skinless chicken or turkey. Ground chicken or turkey. Pork trimmed of fat. All fish and seafood. Eggs. Dried beans, peas, or lentils. Unsalted nuts and seeds. Unsalted canned beans.  Dairy Low-fat dairy products, such as skim or 1% milk, 2% or reduced-fat cheeses, low-fat ricotta or cottage cheese, or plain low-fat yogurt. Low-sodium or reduced-sodium cheeses.  Fats and Oils Tub margarines without trans fats. Light or reduced-fat mayonnaise and salad dressings (reduced sodium). Avocado. Safflower, olive, or canola oils. Natural peanut or almond butter.  Other Unsalted popcorn and pretzels. The items listed above may not be a complete list of recommended foods or beverages. Contact your dietitian for more options.  WHAT FOODS ARE NOT RECOMMENDED?  Grains White bread. White pasta. White rice. Refined cornbread. Bagels and croissants. Crackers that contain trans fat.  Vegetables Creamed or fried vegetables. Vegetables in a cheese sauce. Regular canned vegetables. Regular canned tomato sauce and paste. Regular tomato and vegetable juices.  Fruits Dried fruits. Canned fruit in light or heavy syrup. Fruit juice.  Meat and Other Protein Products Fatty cuts of meat. Ribs, chicken wings, bacon, sausage, bologna, salami, chitterlings, fatback, hot dogs, bratwurst, and packaged luncheon meats. Salted nuts and seeds. Canned beans with salt.    Dairy Whole or 2% milk, cream, half-and-half, and cream cheese. Whole-fat or sweetened yogurt. Full-fat cheeses or blue cheese. Nondairy creamers and whipped toppings. Processed cheese,  cheese spreads, or cheese curds.  Condiments Onion and garlic salt, seasoned salt, table salt, and sea salt. Canned and packaged gravies. Worcestershire sauce. Tartar sauce. Barbecue sauce. Teriyaki sauce. Soy sauce, including reduced sodium. Steak sauce. Fish sauce. Oyster sauce. Cocktail sauce. Horseradish. Ketchup and mustard. Meat flavorings and tenderizers. Bouillon cubes. Hot sauce. Tabasco sauce. Marinades. Taco seasonings. Relishes.  Fats and Oils Butter, stick margarine, lard, shortening, ghee, and bacon fat. Coconut, palm kernel, or palm oils. Regular salad dressings.  Other Pickles and olives. Salted popcorn and pretzels.  The items listed above may not be a complete list of foods and beverages to avoid. Contact your dietitian for more information.  WHERE CAN I FIND MORE INFORMATION? National Heart, Lung, and Blood Institute: www.nhlbi.nih.gov/health/health-topics/topics/dash/ Document Released: 02/14/2011 Document Revised: 07/12/2013 Document Reviewed: 12/30/2012 ExitCare Patient Information 2015 [employer]. This information is not intended to replace advice given to you by your health care provider. Make sure you discuss any questions you have with your health care provider.   I think that you would greatly benefit from seeing a nutritionist.  If you are interested, please call Dr Sykes at [phone] to schedule an appointment.   

## 2018-10-15 LAB — CMP14+EGFR
ALT: 20 IU/L (ref 0–32)
AST: 30 IU/L (ref 0–40)
Albumin/Globulin Ratio: 2 (ref 1.2–2.2)
Albumin: 4.5 g/dL (ref 3.8–4.8)
Alkaline Phosphatase: 91 IU/L (ref 39–117)
BUN/Creatinine Ratio: 11 (ref 9–23)
BUN: 8 mg/dL (ref 6–24)
Bilirubin Total: 0.4 mg/dL (ref 0.0–1.2)
CO2: 27 mmol/L (ref 20–29)
Calcium: 9.4 mg/dL (ref 8.7–10.2)
Chloride: 94 mmol/L — ABNORMAL LOW (ref 96–106)
Creatinine, Ser: 0.73 mg/dL (ref 0.57–1.00)
GFR calc Af Amer: 118 mL/min/{1.73_m2} (ref >59)
GFR calc non Af Amer: 103 mL/min/{1.73_m2} (ref >59)
Globulin, Total: 2.3 g/dL (ref 1.5–4.5)
Glucose: 83 mg/dL (ref 65–99)
Potassium: 3.5 mmol/L (ref 3.5–5.2)
Sodium: 136 mmol/L (ref 134–144)
Total Protein: 6.8 g/dL (ref 6.0–8.5)

## 2018-10-19 DIAGNOSIS — F314 Bipolar disorder, current episode depressed, severe, without psychotic features: Secondary | ICD-10-CM | POA: Diagnosis not present

## 2018-11-02 ENCOUNTER — Other Ambulatory Visit: Payer: Self-pay | Admitting: *Deleted

## 2018-11-02 DIAGNOSIS — F5104 Psychophysiologic insomnia: Secondary | ICD-10-CM

## 2018-11-02 MED ORDER — TRAZODONE HCL 50 MG PO TABS: 25.0000 mg | ORAL_TABLET | Freq: Every evening | ORAL | 1 refills | Status: DC | PRN

## 2018-12-04 ENCOUNTER — Other Ambulatory Visit: Payer: Self-pay

## 2018-12-04 DIAGNOSIS — Z20822 Contact with and (suspected) exposure to covid-19: Secondary | ICD-10-CM

## 2018-12-04 DIAGNOSIS — R6889 Other general symptoms and signs: Secondary | ICD-10-CM | POA: Diagnosis not present

## 2018-12-05 LAB — NOVEL CORONAVIRUS, NAA: SARS-CoV-2, NAA: NOT DETECTED

## 2018-12-07 ENCOUNTER — Ambulatory Visit (INDEPENDENT_AMBULATORY_CARE_PROVIDER_SITE_OTHER): Payer: BC Managed Care – PPO | Admitting: Family Medicine

## 2018-12-07 ENCOUNTER — Encounter: Payer: Self-pay | Admitting: Family Medicine

## 2018-12-07 DIAGNOSIS — J069 Acute upper respiratory infection, unspecified: Secondary | ICD-10-CM | POA: Diagnosis not present

## 2018-12-07 MED ORDER — AZITHROMYCIN 250 MG PO TABS: ORAL_TABLET | ORAL | 0 refills | Status: DC

## 2018-12-07 MED ORDER — BENZONATATE 100 MG PO CAPS: 200.0000 mg | ORAL_CAPSULE | Freq: Three times a day (TID) | ORAL | 0 refills | Status: DC | PRN

## 2018-12-07 NOTE — Progress Notes (Signed)
Virtual Visit via telephone Note Due to COVID-19 pandemic this visit was conducted virtually. This visit type was conducted due to national recommendations for restrictions regarding the COVID-19 Pandemic (e.g. social distancing, sheltering in place) in an effort to limit this patient's exposure and mitigate transmission in our community. All issues noted in this document were discussed and addressed.  A physical exam was not performed with this format.   I connected with Verne Carrow on 12/07/18 at 0810 by telephone and verified that I am speaking with the correct person using two identifiers. Ammara Raj is currently located at home and family is currently with them during visit. The provider, Monia Pouch, FNP is located in their office at time of visit.  I discussed the limitations, risks, security and privacy concerns of performing an evaluation and management service by telephone and the availability of in person appointments. I also discussed with the patient that there may be a patient responsible charge related to this service. The patient expressed understanding and agreed to proceed.  Subjective:  Patient ID: Verne Carrow, female    DOB: 31-Jan-1976, 43 y.o.   MRN: [id number]  Chief Complaint:  URI   HPI: Mariany Mackintosh is a 43 y.o. female presenting on 12/07/2018 for URI   Pt reports cough, congestion, ear pressure, sore throat, and headache x 6 days. States her and her family have been tested for COVID-19 and results were negative. States she is the only one who continues to have symptoms. States she does have chills and fever with the symptoms. No known sick exposures or recent travel.   URI  This is a new problem. The current episode started 1 to 4 weeks ago. The problem has been gradually worsening. The maximum temperature recorded prior to her arrival was 100.4 - 100.9 F. Associated symptoms include congestion, coughing, ear pain, headaches, a plugged ear sensation,  rhinorrhea, sneezing and a sore throat. Pertinent negatives include no abdominal pain, chest pain, diarrhea, dysuria, joint pain, joint swelling, nausea, neck pain, rash, sinus pain, swollen glands, vomiting or wheezing. She has tried decongestant, increased fluids and acetaminophen for the symptoms. The treatment provided no relief.     Relevant past medical, surgical, family, and social history reviewed and updated as indicated.  Allergies and medications reviewed and updated.   Past Medical History:  Diagnosis Date  . Blurred vision   . Hypertension   . Manic depressive disorder ([place])   . Multiple sclerosis ([place])   . PONV (postoperative nausea and vomiting)     Past Surgical History:  Procedure Laterality Date  . CESAREAN SECTION    . INCISION AND DRAINAGE Right 05/21/2018   Procedure: INCISION AND DRAINAGE OF RIGHT INDEX FINGER AND THUMB;  Surgeon: Daryll Brod, MD;  Location: [facility];  Service: Orthopedics;  Laterality: Right;  FAB  . SPINE SURGERY    . Uterine Ablation      Social History   Socioeconomic History  . Marital status: Married    Spouse name: Not on file  . Number of children: Not on file  . Years of education: Not on file  . Highest education level: Not on file  Occupational History  . Not on file  Social Needs  . Financial resource strain: Not on file  . Food insecurity    Worry: Not on file    Inability: Not on file  . Transportation needs    Medical: Not on file    Non-medical: Not on file  Tobacco Use  . Smoking status: Current Every Day Smoker    Packs/day: 0.50    Types: Cigarettes  . Smokeless tobacco: Never Used  Substance and Sexual Activity  . Alcohol use: Yes    Comment: Occasional  . Drug use: No  . Sexual activity: Yes  Lifestyle  . Physical activity    Days per week: Not on file    Minutes per session: Not on file  . Stress: Not on file  Relationships  . Social Musician on phone: Not on file     Gets together: Not on file    Attends religious service: Not on file    Active member of club or organization: Not on file    Attends meetings of clubs or organizations: Not on file    Relationship status: Not on file  . Intimate partner violence    Fear of current or ex partner: Not on file    Emotionally abused: Not on file    Physically abused: Not on file    Forced sexual activity: Not on file  Other Topics Concern  . Not on file  Social History Narrative  . Not on file    Outpatient Encounter Medications as of 12/07/2018  Medication Sig  . amLODipine (NORVASC) 10 MG tablet Take 1 tablet (10 mg total) by mouth daily.  Marland Kitchen azithromycin (ZITHROMAX Z-PAK) 250 MG tablet As directed  . benzonatate (TESSALON PERLES) 100 MG capsule Take 2 capsules (200 mg total) by mouth 3 (three) times daily as needed for cough.  . citalopram (CELEXA) 40 MG tablet Take 1 tablet (40 mg total) by mouth daily.  . hydrochlorothiazide (HYDRODIURIL) 25 MG tablet Take 1 tablet (25 mg total) by mouth daily.  . traZODone (DESYREL) 50 MG tablet Take 0.5-1 tablets (25-50 mg total) by mouth at bedtime as needed for sleep.   No facility-administered encounter medications on file as of 12/07/2018.     No Known Allergies  Review of Systems  Constitutional: Positive for fever. Negative for activity change, appetite change, chills, diaphoresis, fatigue and unexpected weight change.  HENT: Positive for congestion, ear pain, postnasal drip, rhinorrhea, sinus pressure, sneezing and sore throat. Negative for dental problem, drooling, ear discharge, facial swelling, hearing loss, mouth sores, nosebleeds, sinus pain, tinnitus, trouble swallowing and voice change.   Eyes: Negative.  Negative for photophobia, pain, discharge, redness, itching and visual disturbance.  Respiratory: Positive for cough. Negative for choking, chest tightness, shortness of breath, wheezing and stridor.   Cardiovascular: Negative for chest pain,  palpitations and leg swelling.  Gastrointestinal: Negative for abdominal pain, blood in stool, constipation, diarrhea, nausea and vomiting.  Endocrine: Negative.   Genitourinary: Negative for decreased urine volume, difficulty urinating, dysuria, frequency and urgency.  Musculoskeletal: Negative for arthralgias, joint pain, myalgias and neck pain.  Skin: Negative.  Negative for color change, pallor and rash.  Allergic/Immunologic: Negative.   Neurological: Positive for headaches. Negative for dizziness, tremors, seizures, syncope, facial asymmetry, speech difficulty, weakness, light-headedness and numbness.  Hematological: Negative.   Psychiatric/Behavioral: Negative for confusion, hallucinations, sleep disturbance and suicidal ideas.  All other systems reviewed and are negative.        Observations/Objective: No vital signs or physical exam, this was a telephone or virtual health encounter.  Pt alert and oriented, answers all questions appropriately, and able to speak in full sentences.    Assessment and Plan: Deiona was seen today for uri.  Diagnoses and all orders for this visit:  URI with cough and congestion Reported symptoms consistent with URI. Symptoms have been persistent to worsening over the last 8 days. COVID-19 testing was negative. Will empirically treat with azithromycin. Symptomatic care discussed. Report any new or worsening symptoms. Medications as prescribed. Follow up as needed.  -     azithromycin (ZITHROMAX Z-PAK) 250 MG tablet; As directed -     benzonatate (TESSALON PERLES) 100 MG capsule; Take 2 capsules (200 mg total) by mouth 3 (three) times daily as needed for cough.     Follow Up Instructions: Return if symptoms worsen or fail to improve.    I discussed the assessment and treatment plan with the patient. The patient was provided an opportunity to ask questions and all were answered. The patient agreed with the plan and demonstrated an understanding  of the instructions.   The patient was advised to call back or seek an in-person evaluation if the symptoms worsen or if the condition fails to improve as anticipated.  The above assessment and management plan was discussed with the patient. The patient verbalized understanding of and has agreed to the management plan. Patient is aware to call the clinic if they develop any new symptoms or if symptoms persist or worsen. Patient is aware when to return to the clinic for a follow-up visit. Patient educated on when it is appropriate to go to the emergency department.    I provided 15 minutes of non-face-to-face time during this encounter. The call started at 0810. The call ended at 0825. The other time was used for coordination of care.    Kari Baars, FNP-C [facility] [address] [place], [place] [address] [phone] 12/07/18

## 2019-01-11 ENCOUNTER — Other Ambulatory Visit: Payer: Self-pay | Admitting: Family Medicine

## 2019-01-11 DIAGNOSIS — F411 Generalized anxiety disorder: Secondary | ICD-10-CM

## 2019-01-12 ENCOUNTER — Other Ambulatory Visit: Payer: Self-pay

## 2019-01-13 ENCOUNTER — Ambulatory Visit: Payer: BC Managed Care – PPO | Admitting: Family Medicine

## 2019-01-14 ENCOUNTER — Ambulatory Visit: Payer: BC Managed Care – PPO | Admitting: Family Medicine

## 2019-01-19 ENCOUNTER — Other Ambulatory Visit: Payer: Self-pay | Admitting: *Deleted

## 2019-01-19 DIAGNOSIS — I10 Essential (primary) hypertension: Secondary | ICD-10-CM

## 2019-01-19 MED ORDER — HYDROCHLOROTHIAZIDE 25 MG PO TABS: 25.0000 mg | ORAL_TABLET | Freq: Every day | ORAL | 3 refills | Status: DC

## 2019-01-20 DIAGNOSIS — S6710XA Crushing injury of unspecified finger(s), initial encounter: Secondary | ICD-10-CM | POA: Insufficient documentation

## 2019-01-20 DIAGNOSIS — L03011 Cellulitis of right finger: Secondary | ICD-10-CM | POA: Diagnosis not present

## 2019-03-02 ENCOUNTER — Other Ambulatory Visit: Payer: Self-pay | Admitting: *Deleted

## 2019-03-02 DIAGNOSIS — F411 Generalized anxiety disorder: Secondary | ICD-10-CM

## 2019-03-02 MED ORDER — BUSPIRONE HCL 15 MG PO TABS: ORAL_TABLET | ORAL | 1 refills | Status: DC

## 2019-05-17 ENCOUNTER — Other Ambulatory Visit: Payer: Self-pay | Admitting: Family Medicine

## 2019-05-17 DIAGNOSIS — F339 Major depressive disorder, recurrent, unspecified: Secondary | ICD-10-CM

## 2019-05-17 DIAGNOSIS — F411 Generalized anxiety disorder: Secondary | ICD-10-CM

## 2019-06-15 ENCOUNTER — Other Ambulatory Visit: Payer: Self-pay | Admitting: Family Medicine

## 2019-06-15 DIAGNOSIS — I10 Essential (primary) hypertension: Secondary | ICD-10-CM

## 2019-07-07 ENCOUNTER — Ambulatory Visit: Payer: BC Managed Care – PPO | Admitting: Family Medicine

## 2019-07-29 ENCOUNTER — Other Ambulatory Visit: Payer: Self-pay | Admitting: *Deleted

## 2019-07-29 DIAGNOSIS — I10 Essential (primary) hypertension: Secondary | ICD-10-CM

## 2019-07-29 MED ORDER — HYDROCHLOROTHIAZIDE 25 MG PO TABS: 25.0000 mg | ORAL_TABLET | Freq: Every day | ORAL | 0 refills | Status: DC

## 2019-07-29 NOTE — Telephone Encounter (Signed)
Apt scheduled with Dr. Reece Agar. 30 day supply given until appointment.

## 2019-07-29 NOTE — Telephone Encounter (Signed)
Former Architectural technologist. NTBS 30 days given 06/15/19

## 2019-07-29 NOTE — Addendum Note (Signed)
Addended by: Angela Adam on: 07/29/2019 04:21 PM   Modules accepted: Orders

## 2019-08-20 ENCOUNTER — Other Ambulatory Visit: Payer: Self-pay | Admitting: *Deleted

## 2019-08-20 DIAGNOSIS — F411 Generalized anxiety disorder: Secondary | ICD-10-CM

## 2019-08-20 MED ORDER — BUSPIRONE HCL 15 MG PO TABS: ORAL_TABLET | ORAL | 0 refills | Status: DC

## 2019-08-24 ENCOUNTER — Ambulatory Visit (INDEPENDENT_AMBULATORY_CARE_PROVIDER_SITE_OTHER): Payer: BC Managed Care – PPO | Admitting: Family Medicine

## 2019-08-24 ENCOUNTER — Encounter: Payer: Self-pay | Admitting: Family Medicine

## 2019-08-24 ENCOUNTER — Other Ambulatory Visit: Payer: Self-pay

## 2019-08-24 VITALS — BP 126/81 | HR 85 | Temp 97.9°F | Ht 67.0 in | Wt 180.0 lb

## 2019-08-24 DIAGNOSIS — F411 Generalized anxiety disorder: Secondary | ICD-10-CM

## 2019-08-24 DIAGNOSIS — G35D Multiple sclerosis, unspecified: Secondary | ICD-10-CM

## 2019-08-24 DIAGNOSIS — G35 Multiple sclerosis: Secondary | ICD-10-CM

## 2019-08-24 DIAGNOSIS — F5104 Psychophysiologic insomnia: Secondary | ICD-10-CM | POA: Diagnosis not present

## 2019-08-24 DIAGNOSIS — F339 Major depressive disorder, recurrent, unspecified: Secondary | ICD-10-CM

## 2019-08-24 DIAGNOSIS — Z7689 Persons encountering health services in other specified circumstances: Secondary | ICD-10-CM

## 2019-08-24 DIAGNOSIS — I10 Essential (primary) hypertension: Secondary | ICD-10-CM

## 2019-08-24 MED ORDER — HYDROCHLOROTHIAZIDE 25 MG PO TABS: 25.0000 mg | ORAL_TABLET | Freq: Every day | ORAL | 3 refills | Status: DC

## 2019-08-24 MED ORDER — AMLODIPINE BESYLATE 10 MG PO TABS: 10.0000 mg | ORAL_TABLET | Freq: Every day | ORAL | 3 refills | Status: DC

## 2019-08-24 MED ORDER — CITALOPRAM HYDROBROMIDE 40 MG PO TABS: 40.0000 mg | ORAL_TABLET | Freq: Every day | ORAL | 3 refills | Status: DC

## 2019-08-24 MED ORDER — TRAZODONE HCL 50 MG PO TABS: 50.0000 mg | ORAL_TABLET | Freq: Every evening | ORAL | 1 refills | Status: DC | PRN

## 2019-08-24 MED ORDER — BUSPIRONE HCL 15 MG PO TABS: ORAL_TABLET | ORAL | 3 refills | Status: DC

## 2019-08-24 NOTE — Progress Notes (Signed)
Subjective: CC: est care, generalized anxiety disorder with depression PCP: Raliegh Ip, DO OXB:[id number] Angela Hull is a 44 y.o. female presenting to clinic today for:  1.  Generalized anxiety disorder with depression Patient reports compliance with buspirone 15 mg, Celexa 40 mg.  Insomnia is improved some with 50 mg of trazodone as needed.  She does not use this daily because she does not want to become dependent.  However, even on the days that she uses that she only gets about 5 hours of sleep per night, often waking up about 4:00 in the morning.  She does not want to be put on anything that would cause addiction or dependence.  She is considering melatonin but was unsure if this was safe to take with trazodone  2.  Hypertension Patient reports compliance with hydrochlorothiazide, amlodipine.  No chest pain, shortness of breath, lower extreme edema  3.  Multiple sclerosis Diagnosed at 44 years old.  She has been treated with the "ABCs" of therapy but did not really respond to any of them well and in fact had more side effects than it was helping symptoms.  She tries to maintain a clean diet and adequate exercise to help with symptoms.  At this point she remains functional.    ROS: Per HPI  No Known Allergies Past Medical History:  Diagnosis Date  . Blurred vision   . Hypertension   . Manic depressive disorder ([facility])   . Multiple sclerosis ([facility])   . PONV (postoperative nausea and vomiting)     Current Outpatient Medications:  .  amLODipine (NORVASC) 10 MG tablet, Take 1 tablet (10 mg total) by mouth daily., Disp: 90 tablet, Rfl: 3 .  busPIRone (BUSPAR) 15 MG tablet, TAKE  (1)  TABLET  THREE TIMES DAILY., Disp: 90 tablet, Rfl: 0 .  citalopram (CELEXA) 40 MG tablet, Take 1 tablet (40 mg total) by mouth daily. Needs to be seen for future refills., Disp: 30 tablet, Rfl: 0 .  hydrochlorothiazide (HYDRODIURIL) 25 MG tablet, Take 1 tablet (25 mg total) by mouth daily. Needs to be seen  for further refills., Disp: 30 tablet, Rfl: 0 .  traZODone (DESYREL) 50 MG tablet, Take 0.5-1 tablets (25-50 mg total) by mouth at bedtime as needed for sleep., Disp: 90 tablet, Rfl: 1 Social History   Socioeconomic History  . Marital status: Married    Spouse name: Not on file  . Number of children: Not on file  . Years of education: Not on file  . Highest education level: Not on file  Occupational History  . Not on file  Tobacco Use  . Smoking status: Current Every Day Smoker    Packs/day: 0.50    Types: Cigarettes  . Smokeless tobacco: Never Used  Vaping Use  . Vaping Use: Never used  Substance and Sexual Activity  . Alcohol use: Yes    Comment: Occasional  . Drug use: No  . Sexual activity: Yes  Other Topics Concern  . Not on file  Social History Narrative  . Not on file   Social Determinants of Health   Financial Resource Strain:   . Difficulty of Paying Living Expenses:   Food Insecurity:   . Worried About Programme researcher, broadcasting/film/video in the Last Year:   . Barista in the Last Year:   Transportation Needs:   . Freight forwarder (Medical):   Marland Kitchen Lack of Transportation (Non-Medical):   Physical Activity:   . Days of Exercise per  Week:   . Minutes of Exercise per Session:   Stress:   . Feeling of Stress :   Social Connections:   . Frequency of Communication with Friends and Family:   . Frequency of Social Gatherings with Friends and Family:   . Attends Religious Services:   . Active Member of Clubs or Organizations:   . Attends Archivist Meetings:   Marland Kitchen Marital Status:   Intimate Partner Violence:   . Fear of Current or Ex-Partner:   . Emotionally Abused:   Marland Kitchen Physically Abused:   . Sexually Abused:    Family History  Problem Relation Age of Onset  . Asthma Maternal Grandmother     Objective: Office vital signs reviewed. BP 126/81   Pulse 85   Temp 97.9 F (36.6 C) (Temporal)   Ht 5\' 7"  (1.702 m)   Wt 180 lb (81.6 kg)   BMI 28.19 kg/m    Physical Examination:  General: Awake, alert, well nourished, No acute distress HEENT: Normal, sclera white, MMM Cardio: regular rate and rhythm, S1S2 heard, no murmurs appreciated Pulm: clear to auscultation bilaterally, no wheezes, rhonchi or rales; normal work of breathing on room air Extremities: warm, well perfused, No edema, cyanosis or clubbing; +2 pulses bilaterally MSK: normal gait and station Neuro: Alert and oriented x3.  No focal neurologic deficits Psych: Mood stable, speech normal, affect appropriate, pleasant and interactive Depression screen [facility] 2/9 08/24/2019 10/14/2018 09/17/2018  Decreased Interest 0 1 3  Down, Depressed, Hopeless 0 2 3  PHQ - 2 Score 0 3 6  Altered sleeping 2 2 3   Tired, decreased energy - 1 3  Change in appetite 0 1 3  Feeling bad or failure about yourself  0 3 3  Trouble concentrating 1 1 3   Moving slowly or fidgety/restless - 0 3  Suicidal thoughts 0 0 3  PHQ-9 Score 3 11 27   Difficult doing work/chores - Very difficult -   GAD 7 : Generalized Anxiety Score 08/24/2019 09/17/2018 05/12/2018 06/23/2017  Nervous, Anxious, on Edge 1 3 1 3   Control/stop worrying 2 3 1 3   Worry too much - different things 2 3 1 3   Trouble relaxing 2 3 1 3   Restless 2 3 0 2  Easily annoyed or irritable 0 1 0 0  Afraid - awful might happen 0 3 0 0  Total GAD 7 Score 9 19 4 14   Anxiety Difficulty Somewhat difficult - Not difficult at all Somewhat difficult   Assessment/ Plan: 44 y.o. female   1. Essential hypertension Well-controlled.  Continue current regimen - amLODipine (NORVASC) 10 MG tablet; Take 1 tablet (10 mg total) by mouth daily.  Dispense: 90 tablet; Refill: 3 - hydrochlorothiazide (HYDRODIURIL) 25 MG tablet; Take 1 tablet (25 mg total) by mouth daily.  Dispense: 90 tablet; Refill: 3  2. Generalized anxiety disorder Stable - busPIRone (BUSPAR) 15 MG tablet; TAKE  (1)  TABLET  THREE TIMES DAILY.  Dispense: 270 tablet; Refill: 3 - citalopram (CELEXA) 40 MG  tablet; Take 1 tablet (40 mg total) by mouth daily.  Dispense: 90 tablet; Refill: 3  3. Depression, recurrent (HCC) Stable - citalopram (CELEXA) 40 MG tablet; Take 1 tablet (40 mg total) by mouth daily.  Dispense: 90 tablet; Refill: 3  4. Psychophysiological insomnia Okay to increase to 1.5 tablets nightly if melatonin does not help.  Advised no more than 10 mg of melatonin per night.  We did discuss the possibility of serotonin syndrome with SSRI use  and trazodone.  We discussed alternatives including Belsomra, Raza REM. - traZODone (DESYREL) 50 MG tablet; Take 1-1.5 tablets (50-75 mg total) by mouth at bedtime as needed for sleep.  Dispense: 90 tablet; Refill: 1  5. Multiple sclerosis (HCC) Stable without medication  6. Establishing care with new doctor, encounter for   No orders of the defined types were placed in this encounter.  No orders of the defined types were placed in this encounter.  She will schedule an appointment for Pap smear, mammogram  Chadrick Sprinkle Hulen Skains, DO [facility] [phone]

## 2019-09-23 ENCOUNTER — Other Ambulatory Visit: Payer: Self-pay

## 2019-09-23 ENCOUNTER — Ambulatory Visit: Payer: BC Managed Care – PPO | Admitting: Family Medicine

## 2019-09-23 ENCOUNTER — Encounter: Payer: Self-pay | Admitting: Family Medicine

## 2019-09-23 VITALS — BP 125/84 | HR 88 | Temp 97.1°F | Resp 20 | Ht 67.0 in | Wt 176.0 lb

## 2019-09-23 DIAGNOSIS — N6320 Unspecified lump in the left breast, unspecified quadrant: Secondary | ICD-10-CM | POA: Diagnosis not present

## 2019-09-23 DIAGNOSIS — L304 Erythema intertrigo: Secondary | ICD-10-CM

## 2019-09-23 MED ORDER — KETOCONAZOLE 2 % EX CREA: 1.0000 "application " | TOPICAL_CREAM | Freq: Every day | CUTANEOUS | 0 refills | Status: DC

## 2019-09-23 NOTE — Progress Notes (Signed)
Subjective: CC: breast lump PCP: Raliegh Ip, DO XKP:[id number] Angela Hull is a 44 y.o. female presenting to clinic today for:  1. Breast lump Patient reports that when she was bathing yesterday she felt a several little BB sized masses within her left breast just inferior to her nipple.  She notes that these are new and she always does self breast exams and has never palpated these before.  She was told when she was pregnant that she had cystic breasts and dense breast tissue.  She has never had a mammogram but was set up for 1 in August on one of the mammogram mobile buses.  No nipple discharge, unplanned weight loss, night sweats.  No pain.  No known family history of breast cancer, colon cancer, ovarian cancer or prostate cancer.   ROS: Per HPI  No Known Allergies Past Medical History:  Diagnosis Date   Blurred vision    Hypertension    Manic depressive disorder (HCC)    Multiple sclerosis (HCC)    PONV (postoperative nausea and vomiting)     Current Outpatient Medications:    amLODipine (NORVASC) 10 MG tablet, Take 1 tablet (10 mg total) by mouth daily., Disp: 90 tablet, Rfl: 3   busPIRone (BUSPAR) 15 MG tablet, TAKE  (1)  TABLET  THREE TIMES DAILY., Disp: 270 tablet, Rfl: 3   citalopram (CELEXA) 40 MG tablet, Take 1 tablet (40 mg total) by mouth daily., Disp: 90 tablet, Rfl: 3   hydrochlorothiazide (HYDRODIURIL) 25 MG tablet, Take 1 tablet (25 mg total) by mouth daily., Disp: 90 tablet, Rfl: 3   traZODone (DESYREL) 50 MG tablet, Take 1-1.5 tablets (50-75 mg total) by mouth at bedtime as needed for sleep., Disp: 90 tablet, Rfl: 1 Social History   Socioeconomic History   Marital status: Married    Spouse name: Not on file   Number of children: Not on file   Years of education: Not on file   Highest education level: Not on file  Occupational History   Not on file  Tobacco Use   Smoking status: Current Every Day Smoker    Packs/day: 0.50    Types:  Cigarettes   Smokeless tobacco: Never Used  Vaping Use   Vaping Use: Never used  Substance and Sexual Activity   Alcohol use: Yes    Comment: Occasional   Drug use: No   Sexual activity: Yes  Other Topics Concern   Not on file  Social History Narrative   Not on file   Social Determinants of Health   Financial Resource Strain:    Difficulty of Paying Living Expenses:   Food Insecurity:    Worried About Programme researcher, broadcasting/film/video in the Last Year:    Barista in the Last Year:   Transportation Needs:    Freight forwarder (Medical):    Lack of Transportation (Non-Medical):   Physical Activity:    Days of Exercise per Week:    Minutes of Exercise per Session:   Stress:    Feeling of Stress :   Social Connections:    Frequency of Communication with Friends and Family:    Frequency of Social Gatherings with Friends and Family:    Attends Religious Services:    Active Member of Clubs or Organizations:    Attends Banker Meetings:    Marital Status:   Intimate Partner Violence:    Fear of Current or Ex-Partner:    Emotionally Abused:    Physically  Abused:    Sexually Abused:    Family History  Problem Relation Age of Onset   Asthma Maternal Grandmother     Objective: Office vital signs reviewed. BP (!) 136/94    Pulse 88    Temp (!) 97.1 F (36.2 C)    Resp 20    Ht 5\' 7"  (1.702 m)    Wt 176 lb (79.8 kg)    SpO2 97%    BMI 27.57 kg/m   Physical Examination:  General: Awake, alert, well nourished, No acute distress Breast: Pendulous but symmetric.  No nipple inversion or skin changes appreciated.  No palpable dominant masses in either of the breast but there are several BB sized well-circumscribed mobile lesions within the left breast below and around the areola.  No nipple discharge.  No axillary, sternal or supraclavicular lymphadenopathy. Skin: Slightly erythematous, raised borders with central clearing rash noted  underneath the right breast.  Assessment/ Plan: 44 y.o. female   1. Left breast mass Given new onset I am placing a stat breast ultrasound and mammogram. - MM DIAG BREAST TOMO BILATERAL; Future - 45 BREAST COMPLETE UNI LEFT INC AXILLA; Future  2. Intertrigo Topical ketoconazole applied to the affected area once daily for the next 1 to 2 weeks. - ketoconazole (NIZORAL) 2 % cream; Apply 1 application topically daily. To rash under breast x 1-2 weeks  Dispense: 30 g; Refill: 0   No orders of the defined types were placed in this encounter.  No orders of the defined types were placed in this encounter.    [place], DO [facility] [phone]

## 2019-09-23 NOTE — Patient Instructions (Signed)
Intertrigo Intertrigo is skin irritation (inflammation) that happens in warm, moist areas of the body. The irritation can cause a rash and make skin raw and itchy. The rash is usually pink or red. It happens mostly between folds of skin or where skin rubs together, such as:  Between the toes.  In the armpits.  In the groin area.  Under the belly.  Under the breasts.  Around the butt area. This condition is not passed from person to person (is not contagious). What are the causes?  Heat, moisture, rubbing, and not enough air movement.  The condition can be made worse by: ? Sweat. ? Bacteria. ? A fungus, such as yeast. What increases the risk?  Moisture in your skin folds.  You are more likely to develop this condition if you: ? Have diabetes. ? Are overweight. ? Are not able to move around. ? Live in a warm and moist climate. ? Wear splints, braces, or other medical devices. ? Are not able to control your pee (urine) or poop (stool). What are the signs or symptoms?  A pink or red skin rash in the skin fold or near the skin fold.  Raw or scaly skin.  Itching.  A burning feeling.  Bleeding.  Leaking fluid.  A bad smell. How is this treated?  Cleaning and drying your skin.  Taking an antibiotic medicine or using an antibiotic skin cream for a bacterial infection.  Using an antifungal cream on your skin or taking pills for an infection that was caused by a fungus, such as yeast.  Using a steroid ointment to stop the itching and irritation.  Separating the skin fold with a clean cotton cloth to absorb moisture and allow air to flow into the area. Follow these instructions at home:  Keep the affected area clean and dry.  Do not scratch your skin.  Stay cool as much as you can. Use an air conditioner or a fan, if you have one.  Apply over-the-counter and prescription medicines only as told by your doctor.  If you were prescribed an antibiotic medicine,  use it as told by your doctor. Do not stop using the antibiotic even if your condition starts to get better.  Keep all follow-up visits as told by your doctor. This is important. How is this prevented?   Stay at a healthy weight.  Take care of your feet. This is very important if you have diabetes. You should: ? Wear shoes that fit well. ? Keep your feet dry. ? Wear clean cotton or wool socks.  Protect the skin in your groin and butt area as told by your doctor. To do this: ? Follow a regular cleaning routine. ? Use creams, powders, or ointments that protect your skin. ? Change protection pads often.  Do not wear tight clothes. Wear clothes that: ? Are loose. ? Take moisture away from your body. ? Are made of cotton.  Wear a bra that gives good support, if needed.  Shower and dry yourself well after being active. Use a hair dryer on a cool setting to dry between skin folds.  Keep your blood sugar under control if you have diabetes. Contact a doctor if:  Your symptoms do not get better with treatment.  Your symptoms get worse or they spread.  You notice more redness and warmth.  You have a fever. Summary  Intertrigo is skin irritation that occurs when folds of skin rub together.  This condition is caused by heat, moisture,   and rubbing.  This condition may be treated by cleaning and drying your skin and with medicines.  Apply over-the-counter and prescription medicines only as told by your doctor.  Keep all follow-up visits as told by your doctor. This is important. This information is not intended to replace advice given to you by your health care provider. Make sure you discuss any questions you have with your health care provider. Document Revised: 12/04/2017 Document Reviewed: 12/04/2017 Elsevier Patient Education  2020 [employer]  

## 2019-09-24 ENCOUNTER — Other Ambulatory Visit: Payer: Self-pay | Admitting: Family Medicine

## 2019-09-24 DIAGNOSIS — N6320 Unspecified lump in the left breast, unspecified quadrant: Secondary | ICD-10-CM

## 2019-10-05 ENCOUNTER — Other Ambulatory Visit: Payer: Self-pay

## 2019-10-05 ENCOUNTER — Ambulatory Visit (HOSPITAL_COMMUNITY)
Admission: RE | Admit: 2019-10-05 | Discharge: 2019-10-05 | Disposition: A | Discharge status: Home / Self Care | Payer: BC Managed Care – PPO | Source: Ambulatory Visit | Attending: Family Medicine | Admitting: Family Medicine

## 2019-10-05 DIAGNOSIS — N6320 Unspecified lump in the left breast, unspecified quadrant: Secondary | ICD-10-CM | POA: Insufficient documentation

## 2019-10-05 DIAGNOSIS — R928 Other abnormal and inconclusive findings on diagnostic imaging of breast: Secondary | ICD-10-CM | POA: Diagnosis not present

## 2019-10-05 DIAGNOSIS — N6323 Unspecified lump in the left breast, lower outer quadrant: Secondary | ICD-10-CM | POA: Diagnosis not present

## 2019-10-05 DIAGNOSIS — N6321 Unspecified lump in the left breast, upper outer quadrant: Secondary | ICD-10-CM | POA: Diagnosis not present

## 2019-10-11 ENCOUNTER — Other Ambulatory Visit: Payer: Self-pay

## 2019-11-03 ENCOUNTER — Encounter: Payer: BC Managed Care – PPO | Admitting: Family Medicine

## 2019-11-24 ENCOUNTER — Other Ambulatory Visit: Payer: Self-pay

## 2019-11-24 ENCOUNTER — Encounter: Payer: Self-pay | Admitting: Family Medicine

## 2019-11-24 ENCOUNTER — Ambulatory Visit: Payer: BC Managed Care – PPO | Admitting: Family Medicine

## 2019-11-24 VITALS — BP 143/95 | HR 82 | Temp 97.3°F | Ht 67.0 in | Wt 173.4 lb

## 2019-11-24 DIAGNOSIS — L600 Ingrowing nail: Secondary | ICD-10-CM

## 2019-11-24 MED ORDER — CEPHALEXIN 500 MG PO CAPS: 500.0000 mg | ORAL_CAPSULE | Freq: Four times a day (QID) | ORAL | 0 refills | Status: AC

## 2019-11-24 NOTE — Patient Instructions (Signed)
Paronychia Paronychia is an infection of the skin that surrounds a nail. It usually affects the skin around a fingernail, but it may also occur near a toenail. It often causes pain and swelling around the nail. In some cases, a collection of pus (abscess) can form near or under the nail.  This condition may develop suddenly, or it may develop gradually over a longer period. In most cases, paronychia is not serious, and it will clear up with treatment. What are the causes? This condition may be caused by bacteria or a fungus. These germs can enter the body through an opening in the skin, such as a cut or a hangnail. What increases the risk? This condition is more likely to develop in people who:  Get their hands wet often, such as those who work as dishwashers, bartenders, or nurses.  Bite their fingernails or suck their thumbs.  Trim their nails very short.  Have hangnails or injured fingertips.  Get manicures.  Have diabetes. What are the signs or symptoms? Symptoms of this condition include:  Redness and swelling of the skin near the nail.  Tenderness around the nail when you touch the area.  Pus-filled bumps under the skin at the base and sides of the nail (cuticle).  Fluid or pus under the nail.  Throbbing pain in the area. How is this diagnosed? This condition is diagnosed with a physical exam. In some cases, a sample of pus may be tested to determine what type of bacteria or fungus is causing the condition. How is this treated? Treatment depends on the cause and severity of your condition. If your condition is mild, it may clear up on its own in a few days or after soaking in warm water. If needed, treatment may include:  Antibiotic medicine, if your infection is caused by bacteria.  Antifungal medicine, if your infection is caused by a fungus.  A procedure to drain pus from an abscess.  Anti-inflammatory medicine (corticosteroids). Follow these instructions at  home: Wound care  Keep the affected area clean.  Soak the affected area in warm water, if told to do so by your health care provider. You may be told to do this for 20 minutes, 2-3 times a day.  Keep the area dry when you are not soaking it.  Do not try to drain an abscess yourself.  Follow instructions from your health care provider about how to take care of the affected area. Make sure you: ? Wash your hands with soap and water before you change your bandage (dressing). If soap and water are not available, use hand sanitizer. ? Change your dressing as told by your health care provider.  If you had an abscess drained, check the area every day for signs of infection. Check for: ? Redness, swelling, or pain. ? Fluid or blood. ? Warmth. ? Pus or a bad smell. Medicines   Take over-the-counter and prescription medicines only as told by your health care provider.  If you were prescribed an antibiotic medicine, take it as told by your health care provider. Do not stop taking the antibiotic even if you start to feel better. General instructions  Avoid contact with harsh chemicals.  Do not pick at the affected area. Prevention  To prevent this condition from happening again: ? Wear rubber gloves when washing dishes or doing other tasks that require your hands to get wet. ? Wear gloves if your hands might come in contact with cleaners or other chemicals. ? Avoid   injuring your nails or fingertips. ? Do not bite your nails or tear hangnails. ? Do not cut your nails very short. ? Do not cut your cuticles. ? Use clean nail clippers or scissors when trimming nails. Contact a health care provider if:  Your symptoms get worse or do not improve with treatment.  You have continued or increased fluid, blood, or pus coming from the affected area.  Your finger or knuckle becomes swollen or difficult to move. Get help right away if you have:  A fever or chills.  Redness spreading away  from the affected area.  Joint or muscle pain. Summary  Paronychia is an infection of the skin that surrounds a nail. It often causes pain and swelling around the nail. In some cases, a collection of pus (abscess) can form near or under the nail.  This condition may be caused by bacteria or a fungus. These germs can enter the body through an opening in the skin, such as a cut or a hangnail.  If your condition is mild, it may clear up on its own in a few days. If needed, treatment may include medicine or a procedure to drain pus from an abscess.  To prevent this condition from happening again, wear gloves if doing tasks that require your hands to get wet or to come in contact with chemicals. Also avoid injuring your nails or fingertips. This information is not intended to replace advice given to you by your health care provider. Make sure you discuss any questions you have with your health care provider. Document Revised: 03/14/2017 Document Reviewed: 03/10/2017 Elsevier Patient Education  2020 [employer]  

## 2019-11-27 ENCOUNTER — Encounter: Payer: Self-pay | Admitting: Family Medicine

## 2019-11-27 NOTE — Progress Notes (Signed)
Assessment & Plan:  1. Ingrowing nail with infection - Patient encouraged to soak thumb in warm salt water twice daily.  Started her on Keflex 4 times daily x7 days. - cephALEXin (KEFLEX) 500 MG capsule; Take 1 capsule (500 mg total) by mouth 4 (four) times daily for 7 days.  Dispense: 28 capsule; Refill: 0   Follow up plan: Return if symptoms worsen or fail to improve.  [name], MSN, APRN, FNP-C [facility]  Subjective:   Patient ID: Angela Hull, female    DOB: 1975-08-25, 44 y.o.   MRN: [id number]  HPI: Angela Hull is a 44 y.o. female presenting on 11/24/2019 for ingrown nail (Left thumb x 1 week)  Patient reports she had an ingrown nail of her left thumb that she cut out a week ago.  She reports today it is painful, and she believes it may be infected.  She wanted to come in sooner rather than later because she has had issues with her fingers in the past.    ROS: Negative unless specifically indicated above in HPI.   Relevant past medical history reviewed and updated as indicated.   Allergies and medications reviewed and updated.   Current Outpatient Medications:  .  amLODipine (NORVASC) 10 MG tablet, Take 1 tablet (10 mg total) by mouth daily., Disp: 90 tablet, Rfl: 3 .  busPIRone (BUSPAR) 15 MG tablet, TAKE  (1)  TABLET  THREE TIMES DAILY., Disp: 270 tablet, Rfl: 3 .  citalopram (CELEXA) 40 MG tablet, Take 1 tablet (40 mg total) by mouth daily., Disp: 90 tablet, Rfl: 3 .  hydrochlorothiazide (HYDRODIURIL) 25 MG tablet, Take 1 tablet (25 mg total) by mouth daily., Disp: 90 tablet, Rfl: 3 .  ketoconazole (NIZORAL) 2 % cream, Apply 1 application topically daily. To rash under breast x 1-2 weeks, Disp: 30 g, Rfl: 0 .  traZODone (DESYREL) 50 MG tablet, Take 1-1.5 tablets (50-75 mg total) by mouth at bedtime as needed for sleep., Disp: 90 tablet, Rfl: 1 .  cephALEXin (KEFLEX) 500 MG capsule, Take 1 capsule (500 mg total) by mouth 4 (four) times  daily for 7 days., Disp: 28 capsule, Rfl: 0  No Known Allergies  Objective:   BP (!) 143/95   Pulse 82   Temp (!) 97.3 F (36.3 C) (Temporal)   Ht 5\' 7"  (1.702 m)   Wt 173 lb 6.4 oz (78.7 kg)   BMI 27.16 kg/m    Physical Exam Vitals reviewed.  Constitutional:      General: She is not in acute distress.    Appearance: Normal appearance. She is not ill-appearing, toxic-appearing or diaphoretic.  HENT:     Head: Normocephalic and atraumatic.  Eyes:     General: No scleral icterus.       Right eye: No discharge.        Left eye: No discharge.     Conjunctiva/sclera: Conjunctivae normal.  Cardiovascular:     Rate and Rhythm: Normal rate.  Pulmonary:     Effort: Pulmonary effort is normal. No respiratory distress.  Musculoskeletal:        General: Normal range of motion.     Cervical back: Normal range of motion.  Skin:    General: Skin is warm and dry.     Capillary Refill: Capillary refill takes less than 2 seconds.     Comments: Medial side of the left thumb nail is where patient endorses pain.  The area is mildly swollen without erythema.  There is some callused skin where she dug out her ingrown nail.  A scalpel was used to shave away the callused skin which allowed pus to start draining.  Neurological:     General: No focal deficit present.     Mental Status: She is alert and oriented to person, place, and time. Mental status is at baseline.  Psychiatric:        Mood and Affect: Mood normal.        Behavior: Behavior normal.        Thought Content: Thought content normal.        Judgment: Judgment normal.

## 2019-12-21 ENCOUNTER — Encounter: Payer: BC Managed Care – PPO | Admitting: Family Medicine

## 2019-12-24 ENCOUNTER — Encounter: Payer: Self-pay | Admitting: Family Medicine

## 2020-01-24 ENCOUNTER — Other Ambulatory Visit: Payer: Self-pay | Admitting: Family Medicine

## 2020-01-24 DIAGNOSIS — F5104 Psychophysiologic insomnia: Secondary | ICD-10-CM

## 2020-03-18 IMAGING — DX DG FINGER INDEX 2+V*R*
3 series · 3 of 3 positions shown · non-contrast
Comparison: None.

CLINICAL DATA: Distal index finger laceration 6 months ago.

EXAM:
RIGHT INDEX FINGER 2+V

[finger ap]
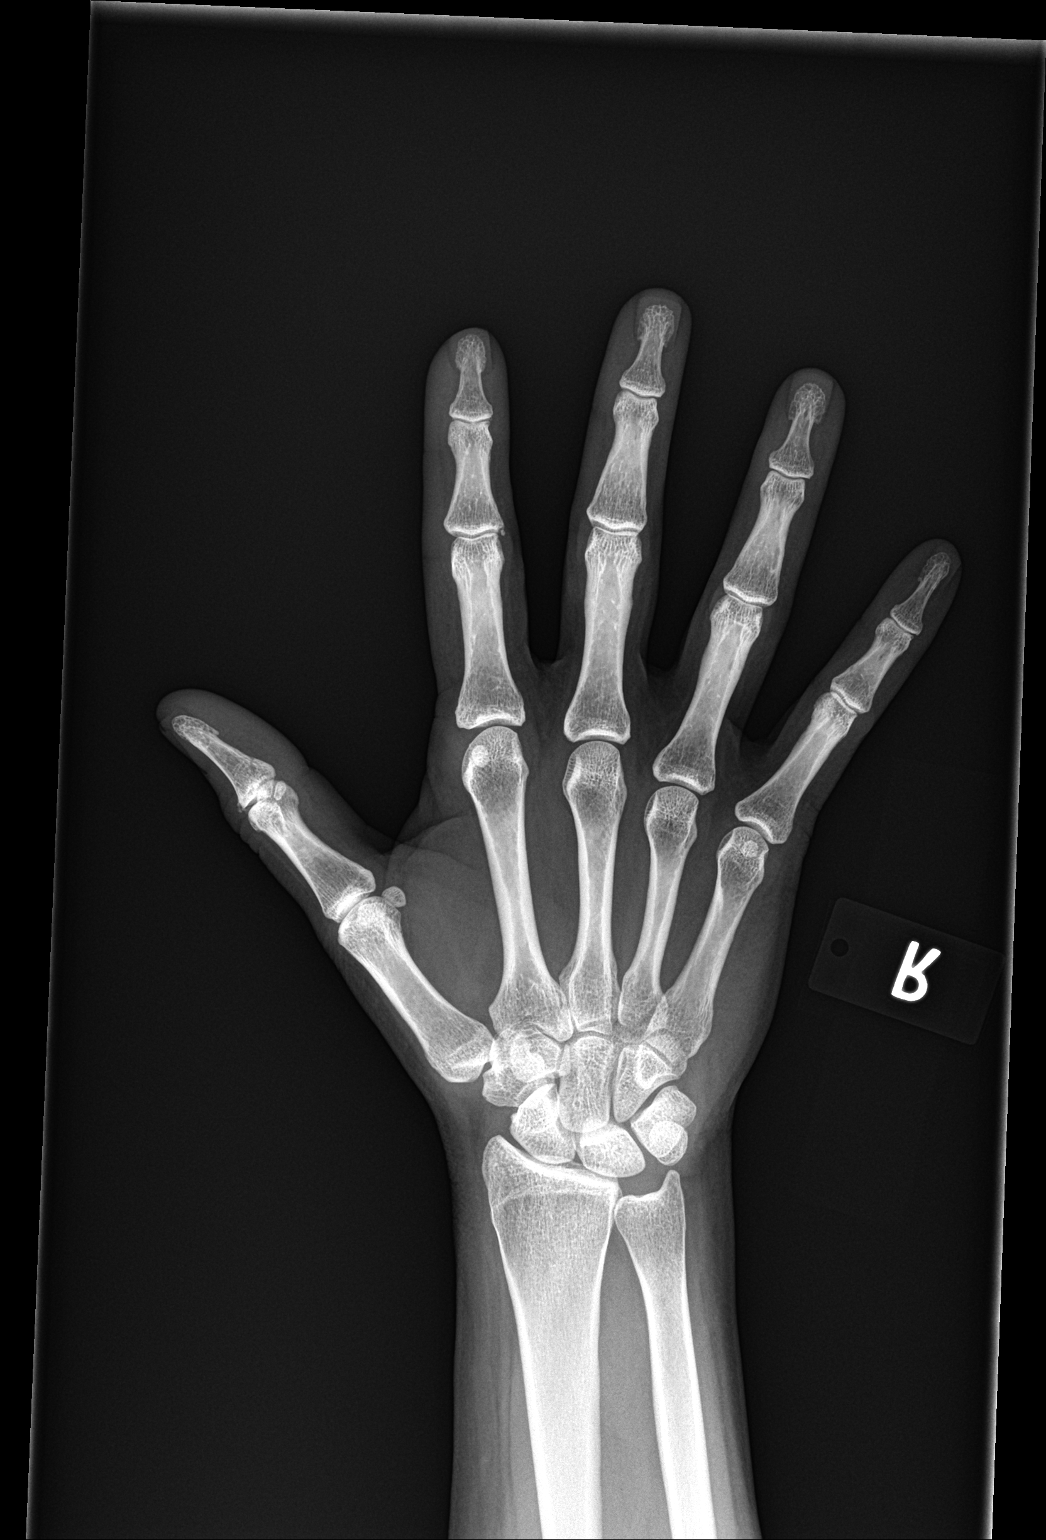

[finger obl]
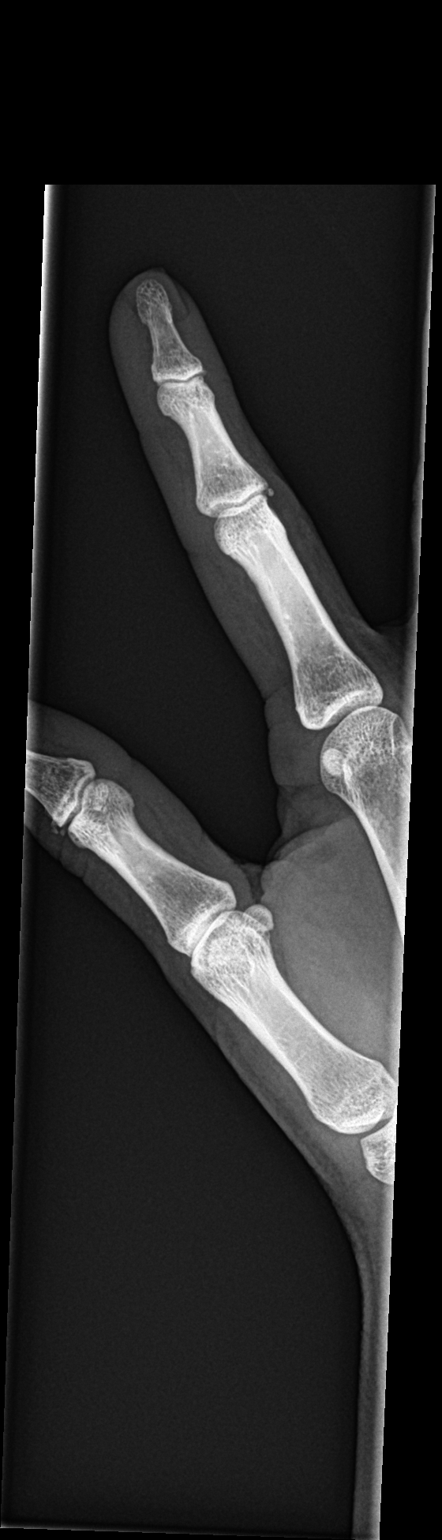

[finger lat]
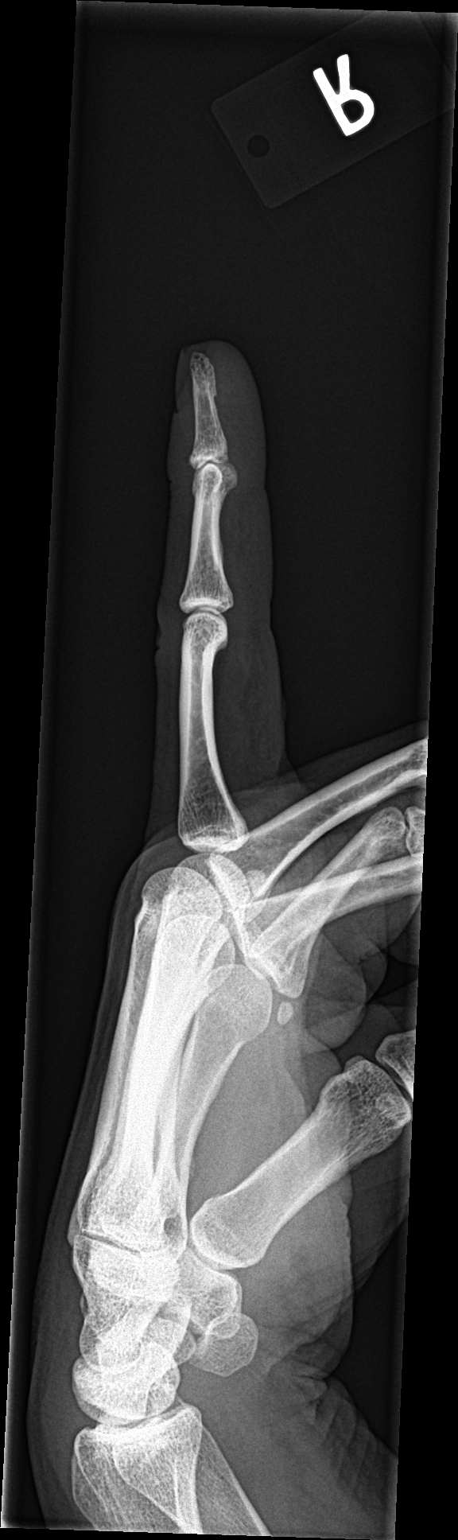

[3 of 3 positions shown; findings below may reference images not displayed]

FINDINGS: There is no evidence of fracture or dislocation. There is no
evidence of arthropathy or other focal bone abnormality. Soft
tissues are unremarkable. No radiopaque foreign body identified.
IMPRESSION: Negative.

## 2020-05-12 ENCOUNTER — Ambulatory Visit (INDEPENDENT_AMBULATORY_CARE_PROVIDER_SITE_OTHER): Payer: BC Managed Care – PPO | Admitting: Nurse Practitioner

## 2020-05-12 ENCOUNTER — Other Ambulatory Visit: Payer: Self-pay

## 2020-05-12 ENCOUNTER — Encounter: Payer: Self-pay | Admitting: Nurse Practitioner

## 2020-05-12 VITALS — BP 139/81 | HR 68 | Temp 97.9°F | Ht 67.0 in | Wt 184.8 lb

## 2020-05-12 DIAGNOSIS — M25531 Pain in right wrist: Secondary | ICD-10-CM | POA: Insufficient documentation

## 2020-05-12 MED ORDER — PREDNISONE 10 MG (21) PO TBPK: ORAL_TABLET | ORAL | 0 refills | Status: DC

## 2020-05-12 MED ORDER — METHYLPREDNISOLONE ACETATE 80 MG/ML IJ SUSP
80.0000 mg | Freq: Once | INTRAMUSCULAR | Status: AC
  Administered 2020-05-12: 80 mg via INTRAMUSCULAR

## 2020-05-12 MED ORDER — NAPROXEN 500 MG PO TABS: 500.0000 mg | ORAL_TABLET | Freq: Two times a day (BID) | ORAL | 1 refills | Status: DC

## 2020-05-12 NOTE — Assessment & Plan Note (Signed)
This is new for patient in the last few weeks.  Patient reports worsening right wrist pain radiating up to his shoulder.  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  History of arthritis in the family.  Patient reports pain is a aching moderate pain that is worsened with flexing or twisting movement to the wrist.  Naproxen every 12 hours as needed for pain ordered, Depo-Medrol shot given in clinic, prednisone taper sent to pharmacy.  Education provided to apply cold compress, immobilize joint, follow-up with worsening or unresolved symptoms.  Patient verbalized understanding.

## 2020-05-12 NOTE — Progress Notes (Signed)
Acute Office Visit  Subjective:    Patient ID: Angela Hull, female    DOB: Jul 02, 1975, 45 y.o.   MRN: [id number]  Chief Complaint  Patient presents with  . Wrist Pain    HPI Patient is in today for Pain  She reports new onset right wrist pain. was not an injury that may have caused the pain. The pain started a few weeks ago and is gradually worsening. The pain does radiate to arm. The pain is described as soreness and stiffness, is moderate in intensity, occurring intermittently. Symptoms are worse in the: all day  Aggravating factors: Movement/flexing Relieving factors: none.  She has tried acetaminophen with no relief.   ---------------------------------------------------------------------------------------------------   Past Medical History:  Diagnosis Date  . Blurred vision   . Hypertension   . Manic depressive disorder (HCC)   . Multiple sclerosis (HCC)   . PONV (postoperative nausea and vomiting)     Past Surgical History:  Procedure Laterality Date  . CESAREAN SECTION    . INCISION AND DRAINAGE Right 05/21/2018   Procedure: INCISION AND DRAINAGE OF RIGHT INDEX FINGER AND THUMB;  Surgeon: Cindee Salt, MD;  [facility]:  [facility];  Service: Orthopedics;  Laterality: Right;  FAB  . SPINE SURGERY    . Uterine Ablation      Family History  Problem Relation Age of Onset  . Asthma Maternal Grandmother     Social History   Socioeconomic History  . Marital status: Married    Spouse name: Not on file  . Number of children: Not on file  . Years of education: Not on file  . Highest education level: Not on file  Occupational History  . Not on file  Tobacco Use  . Smoking status: Current Every Day Smoker    Packs/day: 0.50    Types: Cigarettes  . Smokeless tobacco: Never Used  Vaping Use  . Vaping Use: Never used  Substance and Sexual Activity  . Alcohol use: Yes    Comment: Occasional  . Drug use: No  . Sexual activity: Yes  Other Topics  Concern  . Not on file  Social History Narrative  . Not on file   Social Determinants of Health   Financial Resource Strain: Not on file  Food Insecurity: Not on file  Transportation Needs: Not on file  Physical Activity: Not on file  Stress: Not on file  Social Connections: Not on file  Intimate Partner Violence: Not on file    Outpatient Medications Prior to Visit  Medication Sig Dispense Refill  . amLODipine (NORVASC) 10 MG tablet Take 1 tablet (10 mg total) by mouth daily. 90 tablet 3  . busPIRone (BUSPAR) 15 MG tablet TAKE  (1)  TABLET  THREE TIMES DAILY. 270 tablet 3  . citalopram (CELEXA) 40 MG tablet Take 1 tablet (40 mg total) by mouth daily. 90 tablet 3  . hydrochlorothiazide (HYDRODIURIL) 25 MG tablet Take 1 tablet (25 mg total) by mouth daily. 90 tablet 3  . ketoconazole (NIZORAL) 2 % cream Apply 1 application topically daily. To rash under breast x 1-2 weeks 30 g 0  . traZODone (DESYREL) 50 MG tablet TAKE 1 TO 1&1/2 TABLETS AT BEDTIME AS NEEDED FOR SLEEP 90 tablet 3   No facility-administered medications prior to visit.    No Known Allergies  Review of Systems  Constitutional: Negative.   HENT: Negative.   Respiratory: Negative.   Cardiovascular: Negative.   Gastrointestinal: Negative.   Musculoskeletal: Positive for joint  swelling.  Skin: Negative.   All other systems reviewed and are negative.      Objective:    Physical Exam Vitals reviewed.  Constitutional:      Appearance: Normal appearance.  HENT:     Head: Normocephalic.     Nose: Nose normal.  Eyes:     Conjunctiva/sclera: Conjunctivae normal.  Cardiovascular:     Rate and Rhythm: Normal rate and regular rhythm.     Pulses: Normal pulses.     Heart sounds: Normal heart sounds.  Pulmonary:     Effort: Pulmonary effort is normal.     Breath sounds: Normal breath sounds.  Abdominal:     General: Bowel sounds are normal.  Musculoskeletal:        General: Swelling and tenderness present.   Skin:    General: Skin is warm.  Neurological:     Mental Status: She is alert and oriented to person, place, and time.  Psychiatric:        Behavior: Behavior normal.     BP 139/81   Pulse 68   Temp 97.9 F (36.6 C)   Ht 5\' 7"  (1.702 m)   Wt 184 lb 12.8 oz (83.8 kg)   SpO2 99%   BMI 28.94 kg/m  Wt Readings from Last 3 Encounters:  05/12/20 184 lb 12.8 oz (83.8 kg)  11/24/19 173 lb 6.4 oz (78.7 kg)  09/23/19 176 lb (79.8 kg)    Health Maintenance Due  Topic Date Due  . Hepatitis C Screening  Never done  . HIV Screening  Never done  . PAP SMEAR-Modifier  Never done  . INFLUENZA VACCINE  10/10/2019    There are no preventive care reminders to display for this patient.   Lab Results  Component Value Date   TSH 0.905 09/17/2018   Lab Results  Component Value Date   WBC 10.1 09/17/2018   HGB 15.3 09/17/2018   HCT 42.6 09/17/2018   MCV 101 (H) 09/17/2018   PLT 327 09/17/2018   Lab Results  Component Value Date   NA 136 10/14/2018   K 3.5 10/14/2018   CO2 27 10/14/2018   GLUCOSE 83 10/14/2018   BUN 8 10/14/2018   CREATININE 0.73 10/14/2018   BILITOT 0.4 10/14/2018   ALKPHOS 91 10/14/2018   AST 30 10/14/2018   ALT 20 10/14/2018   PROT 6.8 10/14/2018   ALBUMIN 4.5 10/14/2018   CALCIUM 9.4 10/14/2018   Lab Results  Component Value Date   CHOL 186 05/12/2018   Lab Results  Component Value Date   HDL 75 05/12/2018   Lab Results  Component Value Date   LDLCALC 96 05/12/2018   Lab Results  Component Value Date   TRIG 76 05/12/2018   Lab Results  Component Value Date   CHOLHDL 2.5 05/12/2018   No results found for: HGBA1C     Assessment & Plan:   Problem List Items Addressed This Visit      Other   Right wrist pain - Primary    This is new for patient in the last few weeks.  Patient reports worsening right wrist pain radiating up to his shoulder.  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  History of arthritis in the  family.  Patient reports pain is a aching moderate pain that is worsened with flexing or twisting movement to the wrist.  Naproxen every 12 hours as needed for pain ordered, Depo-Medrol shot given in clinic, prednisone taper sent to  pharmacy.  Education provided to apply cold compress, immobilize joint, follow-up with worsening or unresolved symptoms.  Patient verbalized understanding.      Relevant Medications   naproxen (NAPROSYN) 500 MG tablet   predniSONE (STERAPRED UNI-PAK 21 TAB) 10 MG (21) TBPK tablet       Meds ordered this encounter  Medications  . naproxen (NAPROSYN) 500 MG tablet    Sig: Take 1 tablet (500 mg total) by mouth 2 (two) times daily with a meal.    Dispense:  30 tablet    Refill:  1    Order Specific Question:   Supervising Provider    Answer:   Raliegh Ip [[id number]]  . methylPREDNISolone acetate (DEPO-MEDROL) injection 80 mg  . predniSONE (STERAPRED UNI-PAK 21 TAB) 10 MG (21) TBPK tablet    Sig: 6 tablets day 1, 5 tablet day 2, 4 tablet day 3, 3 tablets day 4, 2 tablet day 5, 1 tablet day 6.    Dispense:  1 each    Refill:  0    Order Specific Question:   Supervising Provider    Answer:   Raliegh Ip [[id number]]     Daryll Drown, NP

## 2020-05-12 NOTE — Patient Instructions (Signed)
Wrist Pain, Adult There are many things that can cause wrist pain. Some common causes include:  An injury to the wrist.  Using the joint too much.  A condition that causes too much pressure to be put on a nerve in the wrist (carpal tunnel syndrome).  Wear and tear of the joints that happens as a person gets older (osteoarthritis).  A condition that causes swelling and stiffness in the joints (arthritis). Sometimes, the cause of wrist pain is not known. Often, the pain goes away when you follow your doctor's instructions for easing pain at home. This may include resting your wrist, icing your wrist, or using a splint or an elastic wrap for a short time. It is important to tell your doctor if your wrist pain does not go away. Follow these instructions at home: If you have a splint or elastic wrap:  Wear the splint or wrap as told by your doctor. Take it off only as told by your doctor. Ask if you can take it off for bathing.  Loosen the splint or wrap if your fingers: ? Tingle. ? Become numb. ? Turn cold and blue.  Check the skin around the splint or wrap every day. Tell your doctor about any concerns.  Keep the splint or wrap clean.  If the splint or wrap is not waterproof: ? Do not let it get wet. ? Cover it with a watertight covering when you take a bath or shower. Managing pain, stiffness, and swelling  If told, put ice on the painful area. To do this: ? If you have a removable splint or wrap, take it off as told by your doctor. ? Put ice in a plastic bag. ? Place a towel between your skin and the bag or between your splint or wrap and the bag. ? Leave the ice on for 20 minutes, 2-3 times a day.  Move your fingers often.  Raise (elevate) the injured area above the level of your heart while you are sitting or lying down.   Activity  Rest your wrist as told by your doctor.  Return to your normal activities as told by your doctor. Ask your doctor what activities are safe  for you.  Ask your doctor when it is safe to drive if you have a splint or wrap on your wrist.  Do exercises as told by your doctor. General instructions  Pay attention to any changes in your symptoms.  Take over-the-counter and prescription medicines only as told by your doctor.  Keep all follow-up visits as told by your doctor. This is important. Contact a doctor if:  You have a sudden, sharp pain in the wrist, hand, or arm that is different or new.  The swelling or bruising on your wrist or hand gets worse.  Your skin: ? Becomes red. ? Gets a rash. ? Has open sores.  Your pain does not get better.  Your pain gets worse.  You have a fever or chills. Get help right away if:  You lose feeling in your fingers or hand.  Your fingers turn white, very red, or cold and blue.  You cannot move your fingers. Summary  There are many things that can cause wrist pain.  It is important to tell your doctor if your wrist pain does not go away.  You may need to wear a splint or a wrap for a short period of time.  Return to your normal activities as told by your doctor. Ask your doctor   what activities are safe for you. This information is not intended to replace advice given to you by your health care provider. Make sure you discuss any questions you have with your health care provider. Document Revised: 01/14/2019 Document Reviewed: 01/14/2019 Elsevier Patient Education  2021 [employer] Inc.  

## 2020-06-07 ENCOUNTER — Ambulatory Visit: Payer: Self-pay | Admitting: Nurse Practitioner

## 2020-08-28 ENCOUNTER — Telehealth: Payer: Self-pay

## 2020-08-28 NOTE — Telephone Encounter (Signed)
Received a call report from the access nurse   Patient called 08/27/20 at 2:55pm about having left wrist that is swollen and red.  Also hot to touch   Attempted to contact patient to schedule and NA  If patient calls back please schedule an appointment.

## 2020-08-31 ENCOUNTER — Ambulatory Visit: Payer: Self-pay | Admitting: Nurse Practitioner

## 2020-09-27 ENCOUNTER — Other Ambulatory Visit: Payer: Self-pay | Admitting: Family Medicine

## 2020-09-27 DIAGNOSIS — I10 Essential (primary) hypertension: Secondary | ICD-10-CM

## 2020-09-27 MED ORDER — AMLODIPINE BESYLATE 10 MG PO TABS: 10.0000 mg | ORAL_TABLET | Freq: Every day | ORAL | 0 refills | Status: DC

## 2020-09-27 NOTE — Telephone Encounter (Signed)
  Prescription Request  09/27/2020  What is the name of the medication or equipment? amlodipine  Have you contacted your pharmacy to request a refill? (if applicable) yes  Which pharmacy would you like this sent to? [facility]    Appt made for 9/2 @ 3   Patient notified that their request is being sent to the clinical staff for review and that they should receive a response within 2 business days.

## 2020-11-10 ENCOUNTER — Ambulatory Visit (INDEPENDENT_AMBULATORY_CARE_PROVIDER_SITE_OTHER): Payer: 59 | Admitting: Family Medicine

## 2020-11-10 ENCOUNTER — Encounter: Payer: Self-pay | Admitting: Family Medicine

## 2020-11-10 ENCOUNTER — Other Ambulatory Visit: Payer: Self-pay

## 2020-11-10 VITALS — BP 141/89 | HR 77 | Temp 98.1°F | Ht 67.0 in | Wt 166.2 lb

## 2020-11-10 DIAGNOSIS — F319 Bipolar disorder, unspecified: Secondary | ICD-10-CM | POA: Diagnosis not present

## 2020-11-10 DIAGNOSIS — I10 Essential (primary) hypertension: Secondary | ICD-10-CM

## 2020-11-10 DIAGNOSIS — F411 Generalized anxiety disorder: Secondary | ICD-10-CM | POA: Diagnosis not present

## 2020-11-10 MED ORDER — AMLODIPINE BESYLATE 10 MG PO TABS: 10.0000 mg | ORAL_TABLET | Freq: Every day | ORAL | 0 refills | Status: DC

## 2020-11-10 MED ORDER — HYDROCHLOROTHIAZIDE 25 MG PO TABS: 25.0000 mg | ORAL_TABLET | Freq: Every day | ORAL | 3 refills | Status: DC

## 2020-11-10 MED ORDER — AMLODIPINE BESYLATE 10 MG PO TABS: 10.0000 mg | ORAL_TABLET | Freq: Every day | ORAL | 3 refills | Status: DC

## 2020-11-10 MED ORDER — CARIPRAZINE HCL 1.5 MG PO CAPS: 1.5000 mg | ORAL_CAPSULE | Freq: Every day | ORAL | 1 refills | Status: DC

## 2020-11-10 NOTE — Progress Notes (Signed)
Subjective: CC: Depression, anxiety PCP: Angela Ip, DO [facility] Angela Hull is a 45 y.o. female presenting to clinic today for:  1.  Depression, anxiety Patient reports longstanding history of depressive anxiety.  She apparently was diagnosed with bipolar disorder when she was younger and was on mood stabilizers.  She has since been on Celexa and BuSpar and had been under fairly good control with this.  She admits that she always goes through seasonal affective depressive disorder but she usually comes out of it in the springtime.  Unfortunately she cannot transition out of depression this time.  She denies any manic symptoms.  No SI or HI.  No visual or auditory hallucinations.   ROS: Per HPI  No Known Allergies Past Medical History:  Diagnosis Date   Blurred vision    Hypertension    Manic depressive disorder (HCC)    Multiple sclerosis (HCC)    PONV (postoperative nausea and vomiting)     Current Outpatient Medications:    amLODipine (NORVASC) 10 MG tablet, Take 1 tablet (10 mg total) by mouth daily., Disp: 90 tablet, Rfl: 0   busPIRone (BUSPAR) 15 MG tablet, TAKE  (1)  TABLET  THREE TIMES DAILY., Disp: 270 tablet, Rfl: 3   citalopram (CELEXA) 40 MG tablet, Take 1 tablet (40 mg total) by mouth daily., Disp: 90 tablet, Rfl: 3   hydrochlorothiazide (HYDRODIURIL) 25 MG tablet, Take 1 tablet (25 mg total) by mouth daily., Disp: 90 tablet, Rfl: 3   traZODone (DESYREL) 50 MG tablet, TAKE 1 TO 1&1/2 TABLETS AT BEDTIME AS NEEDED FOR SLEEP, Disp: 90 tablet, Rfl: 3   ketoconazole (NIZORAL) 2 % cream, Apply 1 application topically daily. To rash under breast x 1-2 weeks (Patient not taking: Reported on 11/10/2020), Disp: 30 g, Rfl: 0   naproxen (NAPROSYN) 500 MG tablet, Take 1 tablet (500 mg total) by mouth 2 (two) times daily with a meal. (Patient not taking: Reported on 11/10/2020), Disp: 30 tablet, Rfl: 1 Social History   Socioeconomic History   Marital status: Married    Spouse  name: Not on file   Number of children: Not on file   Years of education: Not on file   Highest education level: Not on file  Occupational History   Not on file  Tobacco Use   Smoking status: Every Day    Packs/day: 0.50    Types: Cigarettes   Smokeless tobacco: Never  Vaping Use   Vaping Use: Never used  Substance and Sexual Activity   Alcohol use: Yes    Comment: Occasional   Drug use: No   Sexual activity: Yes  Other Topics Concern   Not on file  Social History Narrative   Not on file   Social Determinants of Health   Financial Resource Strain: Not on file  Food Insecurity: Not on file  Transportation Needs: Not on file  Physical Activity: Not on file  Stress: Not on file  Social Connections: Not on file  Intimate Partner Violence: Not on file   Family History  Problem Relation Age of Onset   Asthma Maternal Grandmother     Objective: Office vital signs reviewed. BP (!) 141/89   Pulse 77   Temp 98.1 F (36.7 C) (Temporal)   Ht 5\' 7"  (1.702 m)   Wt 166 lb 3.2 oz (75.4 kg)   BMI 26.03 kg/m   Physical Examination:  General: Awake, alert, tearful HEENT: Sclera injected Psych: Very tearful.  Mood depressed. Depression screen [facility] 2/9  05/12/2020 09/23/2019 08/24/2019  Decreased Interest 0 0 0  Down, Depressed, Hopeless 0 0 0  PHQ - 2 Score 0 0 0  Altered sleeping - - 2  Tired, decreased energy - - -  Change in appetite - - 0  Feeling bad or failure about yourself  - - 0  Trouble concentrating - - 1  Moving slowly or fidgety/restless - - -  Suicidal thoughts - - 0  PHQ-9 Score - - 3  Difficult doing work/chores - - -  Some recent data might be hidden   GAD 7 : Generalized Anxiety Score 08/24/2019 09/17/2018 05/12/2018 06/23/2017  Nervous, Anxious, on Edge 1 3 1 3   Control/stop worrying 2 3 1 3   Worry too much - different things 2 3 1 3   Trouble relaxing 2 3 1 3   Restless 2 3 0 2  Easily annoyed or irritable 0 1 0 0  Afraid - awful might happen 0 3 0 0   Total GAD 7 Score 9 19 4 14   Anxiety Difficulty Somewhat difficult - Not difficult at all Somewhat difficult      Assessment/ Plan: 45 y.o. female   Bipolar depression (HCC) - Plan: cariprazine (VRAYLAR) 1.5 MG capsule  Generalized anxiety disorder  Essential hypertension - Plan: hydrochlorothiazide (HYDRODIURIL) 25 MG tablet, amLODipine (NORVASC) 10 MG tablet, DISCONTINUED: amLODipine (NORVASC) 10 MG tablet  Previously diagnosed with bipolar depression in her teens.  She has been off of any mood stabilizers since that time.  Had previously been under fair control with Celexa and BuSpar but she certainly has had a big change since I saw her last.  We are going to trial Vraylar.  We discussed consideration for GeneSight testing and even referral to psychiatry if not having a good response.  She understands red flag signs and symptoms.  I have given her the national suicide hotline number, [place] crisis hotline number and [facility] crisis hotline number.  Would like to reassess her in the next 4 to 6 weeks, sooner if concerns arise  Blood pressure is borderline but she was visibly upset today.  I made no changes to her medications.  We will reassess her again in the next 4 to 6 weeks  No orders of the defined types were placed in this encounter.  No orders of the defined types were placed in this encounter.    , DO [facility] [phone]

## 2020-11-10 NOTE — Patient Instructions (Signed)
Taking the medicine as directed and not missing any doses is one of the best things you can do to treat your depression.  Here are some things to keep in mind:  Side effects (stomach upset, some increased anxiety) may happen before you notice a benefit.  These side effects typically go away over time. Changes to your dose of medicine or a change in medication all together is sometimes necessary Most people need to be on medication at least 12 months Many people will notice an improvement within two weeks but the full effect of the medication can take up to 4-6 weeks Stopping the medication when you start feeling better often results in a return of symptoms Never discontinue your medication without contacting a health care professional first.  Some medications require gradual discontinuation/ taper and can make you sick if you stop them abruptly.  If your symptoms worsen or you have thoughts of suicide/homicide, PLEASE SEEK IMMEDIATE MEDICAL ATTENTION.  You may always call:  National Suicide Hotline: 800-273-8255 [place] Crisis Line: 336-832-9700 Crisis Recovery in [place]: 800-939-5911   These are available 24 hours a day, 7 days a week.  

## 2020-11-14 ENCOUNTER — Telehealth: Payer: Self-pay | Admitting: *Deleted

## 2020-11-14 ENCOUNTER — Other Ambulatory Visit: Payer: Self-pay | Admitting: Family Medicine

## 2020-11-14 DIAGNOSIS — F5104 Psychophysiologic insomnia: Secondary | ICD-10-CM

## 2020-11-14 DIAGNOSIS — F319 Bipolar disorder, unspecified: Secondary | ICD-10-CM

## 2020-11-14 NOTE — Telephone Encounter (Signed)
Angela Hull (Key: [id number]) Rx #: [id number] Vraylar 1.5MG  capsules Sent to plan   Wait for Determination Please wait for MedImpact 2017 to return a determination.

## 2020-11-14 NOTE — Telephone Encounter (Signed)
Last office visit 11/10/20 Last refill 01/24/20, #90, 3 refills

## 2020-11-16 ENCOUNTER — Ambulatory Visit (INDEPENDENT_AMBULATORY_CARE_PROVIDER_SITE_OTHER): Payer: 59 | Admitting: Family Medicine

## 2020-11-16 ENCOUNTER — Encounter: Payer: Self-pay | Admitting: Family Medicine

## 2020-11-16 DIAGNOSIS — J069 Acute upper respiratory infection, unspecified: Secondary | ICD-10-CM | POA: Diagnosis not present

## 2020-11-16 NOTE — Telephone Encounter (Signed)
Angela Hull (Key: [id number]) Rx #: [id number] Vraylar 1.5MG capsules   Message from Plan This request has not been approved. Based on the information submitted for review, you did not meet our guideline rules for the requested drug. In order for your request to be approved, your provider would need to show that you have met the guideline rules below. The details below are written in medical language. If you have questions, please contact your provider. In some cases, the requested medication or alternatives offered may have additional approval requirements. Our guideline named CARIPRAZINE (Vraylar) requires the following rule be met for approval: A. You have documentation of trial and failure, or contraindication (harmful for) to THREE (3) formulary atypical antipsychotics (i.e., risperidone, quetiapine, olanzapine, ziprasidone, and aripiprazole) Your doctor told us you have bipolar depression (a feeling of sadness/mood disorder). We do not have information showing you have tried, or have a medical reason preventing you from trying (contraindication to) three (3) formulary atypical antipsychotics such as risperidone, quetiapine, olanzapine, ziprasidone, and aripiprazole. This is why your request is denied. Please work with your doctor to use a different medication or get us more information if it will allow us to approve this request. A written notification letter will follow with additional details. 

## 2020-11-16 NOTE — Progress Notes (Signed)
   Virtual Visit  Note Due to COVID-19 pandemic this visit was conducted virtually. This visit type was conducted due to national recommendations for restrictions regarding the COVID-19 Pandemic (e.g. social distancing, sheltering in place) in an effort to limit this patient's exposure and mitigate transmission in our community. All issues noted in this document were discussed and addressed.  A physical exam was not performed with this format.  I connected with Angela Hull on 11/16/20 at 1453 by telephone and verified that I am speaking with the correct person using two identifiers. Angela Hull is currently located at home and no one is currently with her during visit. The provider, Gabriel Earing, FNP is located in their office at time of visit.  I discussed the limitations, risks, security and privacy concerns of performing an evaluation and management service by telephone and the availability of in person appointments. I also discussed with the patient that there may be a patient responsible charge related to this service. The patient expressed understanding and agreed to proceed.  CC: runny nose  History and Present Illness:  HPI Angela Hull repots runny nose, facial tenderness, right ear pressure, HA, postnasal drip, and cough with clear-white sputum that all started suddenly this morning. She denies fever, shortness of breath, chest pain, vomiting, diarrhea, or abdominal pain. She has not taken anything for her symptoms. She was exposed to Covid at work about 1.5 weeks ago. She had 2 negative Covid tests following this exposure.     ROS As per HPI.   Observations/Objective: Alert and oriented x 3. Able to speak in full sentences without difficulty.   Assessment and Plan: Angela Hull was seen today for nasal congestion.  Diagnoses and all orders for this visit:  Viral upper respiratory tract infection Recommended Covid test. She declined PCR test today, she will take a home test  instead. Discussed symptomatic care with plain mucinex, antihistamines, tylenol. She will notify the office if her Covid test is positive. Return to office for new or worsening symptoms, or if symptoms persist.     Follow Up Instructions: Return to office for new or worsening symptoms, or if symptoms persist.     I discussed the assessment and treatment plan with the patient. The patient was provided an opportunity to ask questions and all were answered. The patient agreed with the plan and demonstrated an understanding of the instructions.   The patient was advised to call back or seek an in-person evaluation if the symptoms worsen or if the condition fails to improve as anticipated.  The above assessment and management plan was discussed with the patient. The patient verbalized understanding of and has agreed to the management plan. Patient is aware to call the clinic if symptoms persist or worsen. Patient is aware when to return to the clinic for a follow-up visit. Patient educated on when it is appropriate to go to the emergency department.   Time call ended:  1504  I provided 11 minutes of  non face-to-face time during this encounter.    Gabriel Earing, FNP

## 2020-11-17 ENCOUNTER — Other Ambulatory Visit: Payer: Self-pay | Admitting: Family Medicine

## 2020-11-17 DIAGNOSIS — B9689 Other specified bacterial agents as the cause of diseases classified elsewhere: Secondary | ICD-10-CM

## 2020-11-17 DIAGNOSIS — F319 Bipolar disorder, unspecified: Secondary | ICD-10-CM

## 2020-11-17 DIAGNOSIS — J019 Acute sinusitis, unspecified: Secondary | ICD-10-CM

## 2020-11-17 MED ORDER — AMOXICILLIN-POT CLAVULANATE 875-125 MG PO TABS: 1.0000 | ORAL_TABLET | Freq: Two times a day (BID) | ORAL | 0 refills | Status: DC

## 2020-11-17 MED ORDER — QUETIAPINE FUMARATE 50 MG PO TABS: 50.0000 mg | ORAL_TABLET | Freq: Every day | ORAL | 0 refills | Status: DC

## 2020-12-14 ENCOUNTER — Ambulatory Visit: Payer: 59 | Admitting: Family Medicine

## 2020-12-14 ENCOUNTER — Other Ambulatory Visit: Payer: Self-pay

## 2020-12-15 ENCOUNTER — Ambulatory Visit: Payer: 59 | Admitting: Family Medicine

## 2020-12-20 ENCOUNTER — Encounter: Payer: Self-pay | Admitting: Family Medicine

## 2020-12-20 ENCOUNTER — Ambulatory Visit (INDEPENDENT_AMBULATORY_CARE_PROVIDER_SITE_OTHER): Payer: 59 | Admitting: Family Medicine

## 2020-12-20 ENCOUNTER — Other Ambulatory Visit: Payer: Self-pay

## 2020-12-20 VITALS — BP 133/91 | HR 80 | Temp 97.4°F | Ht 67.0 in | Wt 164.2 lb

## 2020-12-20 DIAGNOSIS — F411 Generalized anxiety disorder: Secondary | ICD-10-CM

## 2020-12-20 DIAGNOSIS — F319 Bipolar disorder, unspecified: Secondary | ICD-10-CM

## 2020-12-20 NOTE — Progress Notes (Signed)
Assessment & Plan:  1-2. Bipolar depression (HCC)/Generalized anxiety disorder - genesight testing collected today - will follow up with PCP regarding results and possible medication adjustments   Follow up plan: Return in about 2 months (around 02/19/2021) for Mood with PCP.  Floy Sabina, NP Student  I personally was present during the history, physical exam, and medical decision-making activities of this service and have verified that the service and findings are accurately documented in the nurse practitioner student's note.  [name], MSN, APRN, FNP-C [facility]   Subjective:   Patient ID: Angela Hull, female    DOB: January 10, 1976, 45 y.o.   MRN: [id number]  HPI: Ayahna Solazzo is a 45 y.o. female presenting on 12/20/2020 for gene sight  She has a history of anxiety and depression, as well as bipolar disorder that was diagnosed as a child. She is currently on Celexa, Buspar, Seroquel, and Trazodone. She has failed therapy with Zoloft and Wellbutrin. 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 Her PCP wanted to try her on Vraylar, which was not covered by her insurance. She presents today for genesight testing.    ROS: Negative unless specifically indicated above in HPI.   Relevant past medical history reviewed and updated as indicated.   Allergies and medications reviewed and updated.   Current Outpatient Medications:    amLODipine (NORVASC) 10 MG tablet, Take 1 tablet (10 mg total) by mouth daily., Disp: 90 tablet, Rfl: 3   busPIRone (BUSPAR) 15 MG tablet, TAKE  (1)  TABLET  THREE TIMES DAILY., Disp: 270 tablet, Rfl: 3   citalopram (CELEXA) 40 MG tablet, Take 1 tablet (40 mg total) by mouth daily., Disp: 90 tablet, Rfl: 3   hydrochlorothiazide (HYDRODIURIL) 25 MG tablet, Take 1 tablet (25 mg total) by mouth daily., Disp: 90 tablet, Rfl:  3   ketoconazole (NIZORAL) 2 % cream, Apply 1 application topically daily. To rash under breast x 1-2 weeks, Disp: 30 g, Rfl: 0   naproxen (NAPROSYN) 500 MG tablet, Take 1 tablet (500 mg total) by mouth 2 (two) times daily with a meal., Disp: 30 tablet, Rfl: 1   QUEtiapine (SEROQUEL) 50 MG tablet, Take 1 tablet (50 mg total) by mouth at bedtime., Disp: 90 tablet, Rfl: 0   traZODone (DESYREL) 50 MG tablet, TAKE 1 TO 1&1/2 TABLETS AT BEDTIME AS NEEDED FOR SLEEP, Disp: 90 tablet, Rfl: 3  No Known Allergies  Objective:   BP (!) 133/91   Pulse 80   Temp (!) 97.4 F (36.3 C) (Temporal)   Ht 5\' 7"  (1.702 m)   Wt 74.5 kg   BMI 25.72 kg/m    Physical Exam Vitals reviewed.  Constitutional:      General: She is not in acute distress.    Appearance: Normal appearance. She is not ill-appearing, toxic-appearing or diaphoretic.  HENT:     Head: Normocephalic and atraumatic.  Eyes:     General: No scleral icterus.       Right eye: No discharge.        Left eye: No discharge.     Conjunctiva/sclera: Conjunctivae normal.  Cardiovascular:     Rate and Rhythm: Normal rate.  Pulmonary:     Effort: Pulmonary effort is normal. No respiratory distress.  Musculoskeletal:        General: Normal range of motion.     Cervical back: Normal  range of motion.  Skin:    General: Skin is warm and dry.     Capillary Refill: Capillary refill takes less than 2 seconds.  Neurological:     General: No focal deficit present.     Mental Status: She is alert and oriented to person, place, and time. Mental status is at baseline.  Psychiatric:        Mood and Affect: Mood normal.        Behavior: Behavior normal.        Thought Content: Thought content normal.        Judgment: Judgment normal.

## 2020-12-25 ENCOUNTER — Ambulatory Visit (INDEPENDENT_AMBULATORY_CARE_PROVIDER_SITE_OTHER): Payer: 59 | Admitting: Family Medicine

## 2020-12-25 ENCOUNTER — Other Ambulatory Visit: Payer: Self-pay

## 2020-12-25 ENCOUNTER — Other Ambulatory Visit (HOSPITAL_COMMUNITY)
Admission: RE | Admit: 2020-12-25 | Discharge: 2020-12-25 | Disposition: A | Discharge status: Home / Self Care | Payer: BC Managed Care – PPO | Source: Ambulatory Visit | Attending: Family Medicine | Admitting: Family Medicine

## 2020-12-25 ENCOUNTER — Encounter: Payer: Self-pay | Admitting: Family Medicine

## 2020-12-25 VITALS — BP 135/81 | HR 71 | Temp 97.9°F | Resp 20 | Ht 67.0 in | Wt 168.0 lb

## 2020-12-25 DIAGNOSIS — F319 Bipolar disorder, unspecified: Secondary | ICD-10-CM

## 2020-12-25 DIAGNOSIS — F411 Generalized anxiety disorder: Secondary | ICD-10-CM

## 2020-12-25 DIAGNOSIS — I10 Essential (primary) hypertension: Secondary | ICD-10-CM | POA: Diagnosis not present

## 2020-12-25 DIAGNOSIS — Z124 Encounter for screening for malignant neoplasm of cervix: Secondary | ICD-10-CM

## 2020-12-25 DIAGNOSIS — Z0001 Encounter for general adult medical examination with abnormal findings: Secondary | ICD-10-CM

## 2020-12-25 DIAGNOSIS — Z0000 Encounter for general adult medical examination without abnormal findings: Secondary | ICD-10-CM

## 2020-12-25 NOTE — Patient Instructions (Addendum)
If you decide to proceed with freezing the warts, let us know.  You had labs performed today.  You will be contacted with the results of the labs once they are available, usually in the next 3 business days for routine lab work.  If you have an active my chart account, they will be released to your MyChart.  If you prefer to have these labs released to you via telephone, please let us know.  If you had a pap smear or biopsy performed, expect to be contacted in about 7-10 days.  Warts Warts are small growths on the skin. They are common and can occur on many areas of the body. A person may have one wart or several warts. In many cases, warts do not require treatment. They usually go away on their own over a period of many months to a few years. If needed, warts that cause problems or do not go away on their own can be treated. What are the causes? Warts are caused by a type of virus that is called human papillomavirus (HPV). This virus can spread from person to person through direct contact. Warts can also spread to other areas of the body when a person scratches a wart and then scratches another area of his or her body. What increases the risk? You are more likely to develop this condition if: You are 78-53 years old. You have a weakened body defense system (immune system). You are Caucasian. What are the signs or symptoms? The main symptom of this condition is small growths on the skin. Warts may: Be round or oval or have an irregular shape. Have a rough surface. Range in color from skin color to light yellow, brown, or gray. Generally be less than  inch (1.3 cm) in size. Go away and then come back again. Most warts are painless, but some can be painful if they are large or occur in an area of the body where pressure will be applied to them, such as the bottom of the foot. How is this diagnosed? A wart can usually be diagnosed based on its appearance. In some cases, a tissue sample may  be removed (biopsy) to be looked at under a microscope. How is this treated? In many cases, warts do not need treatment. Sometimes treatment is desired. If treatment is needed or desired, options may include: Applying medicated solutions, creams, or patches to the wart. These may be over-the-counter or prescription medicines that make the skin soft so that layers will gradually shed away. In many cases, the medicine is applied one or two times per day and covered with a bandage. Putting duct tape over the top of the wart (occlusion). You will leave the tape in place for as long as told by your health care provider and then replace it with a new strip of tape. This is done until the wart goes away. Freezing the wart with liquid nitrogen (cryotherapy). Burning the wart with: Laser treatment. An electrified probe (electrocautery). Injection of a medicine (Candida antigen) into the wart to help the body's immune system fight off the wart. Surgery to remove the wart. Follow these instructions at home: Medicines Apply over-the-counter and prescription medicines only as told by your health care provider. Do not apply over-the-counter wart medicines to your face or genitals unless your health care provider tells you to do that. Lifestyle Keep your immune system healthy. To do this: Eat a healthy, balanced diet. Get enough sleep. Do not use any products  that contain nicotine or tobacco, such as cigarettes and e-cigarettes. If you need help quitting, ask your health care provider. General instructions  Wash your hands after you touch a wart. Do not scratch or pick at a wart. Avoid shaving hair that is over a wart. Keep all follow-up visits as told by your health care provider. This is important. Contact a health care provider if: Your warts do not improve after treatment. You have redness, swelling, or pain at the site of a wart. You have bleeding from a wart that does not stop with light  pressure. You have diabetes and you develop a wart. Summary Warts are small growths on the skin. They are common and can occur on many areas of the body. In many cases, warts do not need treatment. Sometimes treatment is desired. If treatment is needed or desired, there are several treatment options. Apply over-the-counter and prescription medicines only as told by your health care provider. Wash your hands after you touch a wart. Keep all follow-up visits as told by your health care provider. This is important. This information is not intended to replace advice given to you by your health care provider. Make sure you discuss any questions you have with your health care provider. Document Revised: 07/15/2017 Document Reviewed: 07/15/2017 Elsevier Patient Education  2022 [employer].

## 2020-12-25 NOTE — Progress Notes (Signed)
Angela Hull is a 45 y.o. female presents to office today for annual physical exam examination.    Concerns today include: 1. Mood Just had GeneSight testing done last week.  Awaiting results.  She is compliant with Celexa 40 mg daily and Seroquel 50 mg nightly.  She very rarely uses the trazodone unless she absolutely cannot sleep.  She has noticed a little bit of daytime sedation after the Seroquel but nothing significant.  Too soon to tell whether or not this could be helpful for her mood.  She is hoping that the genetic testing will be insightful  2.  Warts Patient has struggled with warts on her palmar aspects of her hands for years.  She is tried multiple over-the-counter remedies but nothing is helped.  She has never had these treated with cryotherapy but would be interested in doing so  Occupation: Educational psychologist, Marital status: Married, Substance use: marijuana Diet: working on low calorie for weight loss, Exercise: tries to stay active Last eye exam: to see Dr Marin Comment at [facility] soon Last dental exam: UTD Last colonoscopy: n/a Last mammogram: needs Last pap smear: needs Refills needed today: n/a Immunizations needed: declines flu Immunization History  Administered Date(s) Administered   Influenza,inj,Quad PF,6+ Mos 12/31/2017, 12/25/2018   Moderna Sars-Covid-2 Vaccination 05/25/2019, 06/22/2019, 02/08/2020   Td 05/12/2018   Tdap 05/12/2018     Past Medical History:  Diagnosis Date   Blurred vision    Hypertension    Manic depressive disorder ([place])    Multiple sclerosis ([place])    PONV (postoperative nausea and vomiting)    Social History   Socioeconomic History   Marital status: Married    Spouse name: Not on file   Number of children: Not on file   Years of education: Not on file   Highest education level: Not on file  Occupational History   Not on file  Tobacco Use   Smoking status: Every Day    Packs/day: 0.50    Types: Cigarettes   Smokeless tobacco: Never  Vaping  Use   Vaping Use: Never used  Substance and Sexual Activity   Alcohol use: Yes    Comment: Occasional   Drug use: No   Sexual activity: Yes  Other Topics Concern   Not on file  Social History Narrative   Not on file   Social Determinants of Health   Financial Resource Strain: Not on file  Food Insecurity: Not on file  Transportation Needs: Not on file  Physical Activity: Not on file  Stress: Not on file  Social Connections: Not on file  Intimate Partner Violence: Not on file   Past Surgical History:  Procedure Laterality Date   CESAREAN SECTION     INCISION AND DRAINAGE Right 05/21/2018   Procedure: INCISION AND DRAINAGE OF RIGHT INDEX FINGER AND THUMB;  Surgeon: Daryll Brod, MD;  Location: [place];  Service: Orthopedics;  Laterality: Right;  FAB   SPINE SURGERY     Uterine Ablation     Family History  Problem Relation Age of Onset   Asthma Maternal Grandmother     Current Outpatient Medications:    amLODipine (NORVASC) 10 MG tablet, Take 1 tablet (10 mg total) by mouth daily., Disp: 90 tablet, Rfl: 3   busPIRone (BUSPAR) 15 MG tablet, TAKE  (1)  TABLET  THREE TIMES DAILY., Disp: 270 tablet, Rfl: 3   citalopram (CELEXA) 40 MG tablet, Take 1 tablet (40 mg total) by mouth daily., Disp: 90 tablet,  Rfl: 3   hydrochlorothiazide (HYDRODIURIL) 25 MG tablet, Take 1 tablet (25 mg total) by mouth daily., Disp: 90 tablet, Rfl: 3   ketoconazole (NIZORAL) 2 % cream, Apply 1 application topically daily. To rash under breast x 1-2 weeks, Disp: 30 g, Rfl: 0   naproxen (NAPROSYN) 500 MG tablet, Take 1 tablet (500 mg total) by mouth 2 (two) times daily with a meal., Disp: 30 tablet, Rfl: 1   QUEtiapine (SEROQUEL) 50 MG tablet, Take 1 tablet (50 mg total) by mouth at bedtime., Disp: 90 tablet, Rfl: 0   traZODone (DESYREL) 50 MG tablet, TAKE 1 TO 1&1/2 TABLETS AT BEDTIME AS NEEDED FOR SLEEP, Disp: 90 tablet, Rfl: 3  No Known Allergies   ROS: Review of Systems Pertinent  items noted in HPI and remainder of comprehensive ROS otherwise negative.    Physical exam BP 135/81   Pulse 71   Temp 97.9 F (36.6 C)   Resp 20   Ht 5\' 7"  (1.702 m)   Wt 168 lb (76.2 kg)   SpO2 98%   BMI 26.31 kg/m  General appearance: alert, cooperative, appears stated age, and no distress Head: Normocephalic, without obvious abnormality, atraumatic Eyes: conjunctivae/corneas clear. PERRL, EOM's intact. Fundi benign. Ears: normal TM's and external ear canals both ears Nose: Nares normal. Septum midline. Mucosa normal. No drainage or sinus tenderness. Throat: lips, mucosa, and tongue normal; teeth and gums normal Neck: no adenopathy, no carotid bruit, supple, symmetrical, trachea midline, and thyroid not enlarged, symmetric, no tenderness/mass/nodules Back: symmetric, no curvature. ROM normal. No CVA tenderness. Lungs: clear to auscultation bilaterally Breasts: normal appearance, no masses or tenderness Heart: regular rate and rhythm, S1, S2 normal, no murmur, click, rub or gallop Abdomen: soft, non-tender; bowel sounds normal; no masses,  no organomegaly Pelvic: cervix normal in appearance, external genitalia normal, no adnexal masses or tenderness, no cervical motion tenderness, rectovaginal septum normal, uterus normal size, shape, and consistency, and vagina normal without discharge Extremities: extremities normal, atraumatic, no cyanosis or edema Pulses: 2+ and symmetric Skin:  Multiple hyperkeratotic lesions noted along bilateral palms Lymph nodes: Cervical, supraclavicular, and axillary nodes normal. Neurologic: Grossly normal Psych: Mood stable, patient very pleasant, interactive  Depression screen [facility] 2/9 12/25/2020 12/20/2020 05/12/2020  Decreased Interest 2 2 0  Down, Depressed, Hopeless 2 1 0  PHQ - 2 Score 4 3 0  Altered sleeping 2 1 -  Tired, decreased energy 1 1 -  Change in appetite 0 1 -  Feeling bad or failure about yourself  1 1 -  Trouble concentrating 1 1  -  Moving slowly or fidgety/restless 1 2 -  Suicidal thoughts 0 0 -  PHQ-9 Score 10 10 -  Difficult doing work/chores Somewhat difficult Somewhat difficult -  Some recent data might be hidden   GAD 7 : Generalized Anxiety Score 12/25/2020 12/20/2020 08/24/2019 09/17/2018  Nervous, Anxious, on Edge 2 2 1 3   Control/stop worrying 2 2 2 3   Worry too much - different things 2 2 2 3   Trouble relaxing 2 2 2 3   Restless 1 2 2 3   Easily annoyed or irritable 1 2 0 1  Afraid - awful might happen 1 2 0 3  Total GAD 7 Score 11 14 9 19   Anxiety Difficulty Somewhat difficult Somewhat difficult Somewhat difficult -    ]Assessment/ Plan: Verne Carrow here for annual physical exam.   Annual physical exam  Screening for malignant neoplasm of cervix - Plan: Cytology - PAP  Bipolar  depression ([place]) - Plan: EKG 12-Lead, CMP14+EGFR, Lipid panel, TSH  Generalized anxiety disorder - Plan: TSH  Essential hypertension - Plan: EKG 12-Lead, CMP14+EGFR, Lipid panel  She will schedule mammogram.  Pap smear obtained today and there was no evidence of abnormality on exam.  She declined influenza vaccination  Obtain EKG, CMP, lipid and TSH given history of bipolar depression.  She is currently treated with Seroquel but we are awaiting her results of recent genetic testing.  EKG without evidence of QTC prolongation  Blood pressure well controlled despite having not taken blood pressure medication today.  Continue to monitor closely.  Follow-up in 3 months for mood  Clayton Bosserman M. Lajuana Ripple, DO

## 2020-12-26 LAB — CMP14+EGFR
ALT: 14 IU/L (ref 0–32)
AST: 23 IU/L (ref 0–40)
Albumin/Globulin Ratio: 1.8 (ref 1.2–2.2)
Albumin: 4.5 g/dL (ref 3.8–4.8)
Alkaline Phosphatase: 88 IU/L (ref 44–121)
BUN/Creatinine Ratio: 9 (ref 9–23)
BUN: 5 mg/dL — ABNORMAL LOW (ref 6–24)
Bilirubin Total: 0.5 mg/dL (ref 0.0–1.2)
CO2: 24 mmol/L (ref 20–29)
Calcium: 9.5 mg/dL (ref 8.7–10.2)
Chloride: 89 mmol/L — ABNORMAL LOW (ref 96–106)
Creatinine, Ser: 0.57 mg/dL (ref 0.57–1.00)
Globulin, Total: 2.5 g/dL (ref 1.5–4.5)
Glucose: 90 mg/dL (ref 70–99)
Potassium: 4.1 mmol/L (ref 3.5–5.2)
Sodium: 128 mmol/L — ABNORMAL LOW (ref 134–144)
Total Protein: 7 g/dL (ref 6.0–8.5)
eGFR: 116 mL/min/{1.73_m2} (ref >59)

## 2020-12-26 LAB — LIPID PANEL
Chol/HDL Ratio: 2.2 ratio (ref 0.0–4.4)
Cholesterol, Total: 229 mg/dL — ABNORMAL HIGH (ref 100–199)
HDL: 104 mg/dL (ref >39)
LDL Chol Calc (NIH): 115 mg/dL — ABNORMAL HIGH (ref 0–99)
Triglycerides: 57 mg/dL (ref 0–149)
VLDL Cholesterol Cal: 10 mg/dL (ref 5–40)

## 2020-12-26 LAB — TSH: TSH: 0.679 u[IU]/mL (ref 0.450–4.500)

## 2020-12-26 NOTE — Progress Notes (Signed)
Pt is returning nurse call.

## 2020-12-27 ENCOUNTER — Telehealth: Payer: Self-pay | Admitting: Family Medicine

## 2020-12-27 NOTE — Telephone Encounter (Signed)
Absolutely.  Will call her after we're done with patients Friday.

## 2020-12-27 NOTE — Telephone Encounter (Signed)
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

## 2020-12-27 NOTE — Telephone Encounter (Signed)
Please let her know I will call her during lunch on Friday.

## 2020-12-27 NOTE — Telephone Encounter (Signed)
Pt missed a call from Dr. Nadine Counts, she had gene sight testing completed on 10/12. Please call back.

## 2020-12-28 LAB — CYTOLOGY - PAP
Adequacy: ABSENT
Comment: NEGATIVE
Diagnosis: NEGATIVE
High risk HPV: NEGATIVE

## 2020-12-29 ENCOUNTER — Telehealth: Payer: Self-pay | Admitting: Family Medicine

## 2020-12-29 DIAGNOSIS — F319 Bipolar disorder, unspecified: Secondary | ICD-10-CM

## 2020-12-29 MED ORDER — CARIPRAZINE HCL 1.5 MG PO CAPS: 1.5000 mg | ORAL_CAPSULE | Freq: Every day | ORAL | 3 refills | Status: DC

## 2020-12-29 NOTE — Telephone Encounter (Signed)
Called patient to discuss her GeneSight testing results.  It appears that the initial medication we were trying to pursue for bipolar depression, Vraylar, is one of the medications that was recommended for her.  The Seroquel falls in her moderate gene drug interaction call him.  During her last visit she admitted that this was not really helping yet but she was hopeful that at some point it would start helping.  I think that it is reasonable to start tapering off of the Seroquel and transition over to [employer].  We will reach out to our clinical pharmacist to see if there is anything special that we need to do to ensure that there is not any cross-reactivity while transitioning her over.  We will also see if we can submit a prior authorization request given these most recent findings and what appears to be a failure of Seroquel thus far.  Additionally, her Celexa fell in her moderate gene drug interaction as well.  I am going to have her start tapering from this medicine in anticipation that we will discontinue it entirely and hopefully transition her to only a monotherapy versus consideration of adding something like Pristiq if needed for any uncontrolled depression or anxiety.  BuSpar likely was in the use as directed call him and we will continue that medication.  If she starts having any uncontrolled anxiety symptoms she will contact me and we can advance the BuSpar if needed  She voiced very good understanding of the plan and appreciation for today's call.  We will get a copy of the GeneSight testing sent out to her via mail  Meds ordered this encounter  Medications   cariprazine (VRAYLAR) 1.5 MG capsule    Sig: Take 1 capsule (1.5 mg total) by mouth daily.    Dispense:  90 capsule    Refill:  3     Vear Staton M. Nadine Counts, DO [facility]

## 2021-01-01 ENCOUNTER — Telehealth: Payer: Self-pay | Admitting: Family Medicine

## 2021-01-01 NOTE — Telephone Encounter (Signed)
Angela Hull (Key: [id number]) Sent to plan Rx #: [id number] Vraylar 1.5MG  capsules   Form MedImpact ePA Form 2017 NCPDP

## 2021-01-04 ENCOUNTER — Encounter: Payer: Self-pay | Admitting: *Deleted

## 2021-01-04 NOTE — Telephone Encounter (Signed)
I have faxed over copied of gene sight testing and completed ppw there faxed back.

## 2021-01-12 NOTE — Telephone Encounter (Signed)
Your prior authorization request has been denied. COMPLETE APPEAL Your request for prior authorization was denied, but an appeal is available for your patient. For assistance, contact our support team at [phone].  Message from plan: This request has not been approved. This request is denied because we did not receive enough information from your provider. We contacted your provider but did not receive a response within the required time limit. Please follow up with your provider. Based on the information submitted for review, you did not meet our guideline rules for the requested drug. In order for your request to be approved, your provider would need to show that you have met the guideline rules below. The details below are written in medical language. If you have questions, please contact your provider. In some cases, the requested medication or alternatives offered may have additional approval requirements. Our guideline named CARIPRAZINE (Vraylar) requires the following rule(s) be met for approval:A. You meet ONE of the following diagnoses:1. Schizophrenia (a type of mental health disorder).2. Acute manic or mixed episodes associated with bipolar I disorder (a type of mood disorder).3. Bipolar depression (a feeling of sadness/mood disorder)B. You are 46 years of age or older.C. There are chart notes/documentation supporting your diagnosis.D. You have documentation of trial and failure, or contraindication (harmful for) to THREE (3) formulary atypical antipsychotics (i.e., risperidone, quetiapine, olanzapine, ziprasidone, and aripiprazole).E. You do NOT have a hypersensitivity (allergic reaction) to cariprazine, including a history of angioedema (a type of swelling) to cariprazine.Your doctor told us that you have bipolar depression. We noted that you have tried Quetiapine. Based on genetic testing, you cannot take Aripiprazole and Risperidone. We do not have documentation (such as chart notes or  prescription claims history) showing you have tried Ziprasidone which is one of the recommended medications based on genetic testing and also formulary agent that does not require a prior authorization. This is the reason why your request is denied. Please work with your doctor to use a different m   Karsten Fells - can a letter be written about the [employer]

## 2021-01-15 NOTE — Telephone Encounter (Signed)
Can you please help

## 2021-01-16 NOTE — Telephone Encounter (Signed)
I do not have experience writing a letter about gene site testing, but I can look in to  I looks like they want to see ziprasidone (try/fail) Would this be something the patient is willing to try? I don't think there is way around this vs. Vraylar based on their response  Did Britney write letter?

## 2021-01-16 NOTE — Telephone Encounter (Signed)
No but I thought the rep had reached out to you on how to handle when he came by.  I can try and put a call to him later today and see if he can assist

## 2021-01-17 NOTE — Telephone Encounter (Signed)
Attempted to reach patient.  Her mailbox was full.  Please inform her that her insurance is going to require that she try the Geodon and fail it before she can be approved for [employer].  I am glad to send this medication in if she is agreeable to switching.  Please let me know if she is still taking the Seroquel and if so if she is taking a full tablet or half dose.

## 2021-01-26 ENCOUNTER — Encounter: Payer: Self-pay | Admitting: Nurse Practitioner

## 2021-01-26 ENCOUNTER — Ambulatory Visit (INDEPENDENT_AMBULATORY_CARE_PROVIDER_SITE_OTHER): Payer: 59 | Admitting: Nurse Practitioner

## 2021-01-26 DIAGNOSIS — J019 Acute sinusitis, unspecified: Secondary | ICD-10-CM | POA: Diagnosis not present

## 2021-01-26 DIAGNOSIS — B9689 Other specified bacterial agents as the cause of diseases classified elsewhere: Secondary | ICD-10-CM | POA: Diagnosis not present

## 2021-01-26 MED ORDER — AZITHROMYCIN 250 MG PO TABS: ORAL_TABLET | ORAL | 0 refills | Status: AC

## 2021-01-26 NOTE — Patient Instructions (Signed)

## 2021-01-26 NOTE — Assessment & Plan Note (Signed)
Take meds as prescribed - Use a cool mist humidifier  -Use saline nose sprays frequently -Force fluids -For fever or aches or pains- take Tylenol or ibuprofen. -Patient will go to [facility] to complete a flu swab and follow-up. -Patient did not home COVID-19 test that is negative -We will treat patient's sinusitis with azithromycin, patient just recently completed Augmentin in September.  Education provided to patient.  Patient verbalized understanding. -If symptoms do not improve, she may need to be COVID tested to rule this out Follow up with worsening unresolved symptoms

## 2021-01-26 NOTE — Progress Notes (Signed)
   Virtual Visit  Note Due to COVID-19 pandemic this visit was conducted virtually. This visit type was conducted due to national recommendations for restrictions regarding the COVID-19 Pandemic (e.g. social distancing, sheltering in place) in an effort to limit this patient's exposure and mitigate transmission in our community. All issues noted in this document were discussed and addressed.  A physical exam was not performed with this format.  I connected with Angela Hull on 01/26/21 at 2:30 PM  by telephone and verified that I am speaking with the correct person using two identifiers. Angela Hull is currently located at home during visit. The provider, Daryll Drown, NP is located in their office at time of visit.  I discussed the limitations, risks, security and privacy concerns of performing an evaluation and management service by telephone and the availability of in person appointments. I also discussed with the patient that there may be a patient responsible charge related to this service. The patient expressed understanding and agreed to proceed.   History and Present Illness:  Sinusitis This is a recurrent problem. The current episode started in the past 7 days. The problem has been gradually worsening since onset. The maximum temperature recorded prior to her arrival was 100.4 - 100.9 F. The pain is moderate. Associated symptoms include congestion, coughing, ear pain, headaches and a sore throat. Past treatments include acetaminophen and oral decongestants. The treatment provided no relief.     Review of Systems  Constitutional:  Positive for fever.  HENT:  Positive for congestion, ear pain and sore throat.   Respiratory:  Positive for cough.   Skin:  Negative for rash.  Neurological:  Positive for headaches.  All other systems reviewed and are negative.   Observations/Objective: Televisit patient not in distress.  Assessment and Plan: Take meds as prescribed - Use a cool  mist humidifier  -Use saline nose sprays frequently -Force fluids -For fever or aches or pains- take Tylenol or ibuprofen. -[facility] and follow-up. -Patient did not home COVID-19 test that is negative -We will treat patient's sinusitis with azithromycin, patient just recently completed Augmentin in September.  Education provided to patient.  Patient verbalized understanding. -If symptoms do not improve, she may need to be COVID tested to rule this out Follow up with worsening unresolved symptoms   Follow Up Instructions: Follow-up with unresolved symptoms.    I discussed the assessment and treatment plan with the patient. The patient was provided an opportunity to ask questions and all were answered. The patient agreed with the plan and demonstrated an understanding of the instructions.   The patient was advised to call back or seek an in-person evaluation if the symptoms worsen or if the condition fails to improve as anticipated.  The above assessment and management plan was discussed with the patient. The patient verbalized understanding of and has agreed to the management plan. Patient is aware to call the clinic if symptoms persist or worsen. Patient is aware when to return to the clinic for a follow-up visit. Patient educated on when it is appropriate to go to the emergency department.   Time call ended: 2:40 PM  I provided 10 minutes of  non face-to-face time during this encounter.    Daryll Drown, NP

## 2021-03-09 ENCOUNTER — Other Ambulatory Visit: Payer: Self-pay | Admitting: Family Medicine

## 2021-03-09 DIAGNOSIS — F339 Major depressive disorder, recurrent, unspecified: Secondary | ICD-10-CM

## 2021-03-09 DIAGNOSIS — F411 Generalized anxiety disorder: Secondary | ICD-10-CM

## 2021-04-09 ENCOUNTER — Other Ambulatory Visit: Payer: Self-pay | Admitting: *Deleted

## 2021-04-09 DIAGNOSIS — F319 Bipolar disorder, unspecified: Secondary | ICD-10-CM

## 2021-04-09 MED ORDER — CARIPRAZINE HCL 1.5 MG PO CAPS: 1.5000 mg | ORAL_CAPSULE | Freq: Every day | ORAL | 3 refills | Status: DC

## 2021-04-09 NOTE — Telephone Encounter (Signed)
Key: [id number]) Rx #: [id number] Vraylar 1.5MG  capsules  PA in process

## 2021-04-10 NOTE — Telephone Encounter (Signed)
PA denied for Vraylar patient must try and fail the following   Formulary alternative(s) are aripiprazole; asenapine maleate; Latuda; olanzapine; quetiapine; quetiapine extended-release; risperidone; ziprasidone. Requirement: 3 in a class with 3 or more alternatives, 2 in a class with 2 alternatives, or 1 in a class with only 1 alternative. Please refer to your plan documents for a complete list of alternatives.

## 2021-04-10 NOTE — Telephone Encounter (Signed)
Can you help with this?  We have tried several already.  The drug rep that came by said that he could help with this if he was called

## 2021-07-17 ENCOUNTER — Ambulatory Visit: Payer: 59 | Admitting: Family Medicine

## 2021-07-17 ENCOUNTER — Encounter: Payer: Self-pay | Admitting: Family Medicine

## 2021-07-17 VITALS — BP 125/76 | HR 79 | Temp 98.1°F | Ht 67.0 in | Wt 166.4 lb

## 2021-07-17 DIAGNOSIS — L03116 Cellulitis of left lower limb: Secondary | ICD-10-CM

## 2021-07-17 DIAGNOSIS — M25572 Pain in left ankle and joints of left foot: Secondary | ICD-10-CM | POA: Diagnosis not present

## 2021-07-17 MED ORDER — AMOXICILLIN 500 MG PO CAPS: 500.0000 mg | ORAL_CAPSULE | Freq: Three times a day (TID) | ORAL | 0 refills | Status: DC

## 2021-07-17 MED ORDER — AMOXICILLIN 500 MG PO TABS: 2000.0000 mg | ORAL_TABLET | Freq: Once | ORAL | 3 refills | Status: DC | PRN

## 2021-07-17 NOTE — Progress Notes (Signed)
? ?Subjective:  ?Patient ID: Angela Hull, female    DOB: 1975/11/20  Age: 46 y.o. MRN: [id number] ? ?CC: Edema (Left ankle swelling and redness and pain) ? ? ?HPI ?Angela Hull presents for Onset yesterday of left ankle pain and redness. [place] and felt a twinge at ankle. Had some pain. Much worse all day yesterday. Rested it overniht and pain was better today when she awoke. 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 Also noting redness at the left ankle. NKI. Moderately severe pain. ? ? ?  12/25/2020  ?  1:57 PM 12/20/2020  ?  9:03 AM 05/12/2020  ?  2:56 PM  ?Depression screen PHQ 2/9  ?Decreased Interest 2 2 0  ?Down, Depressed, Hopeless 2 1 0  ?PHQ - 2 Score 4 3 0  ?Altered sleeping 2 1   ?Tired, decreased energy 1 1   ?Change in appetite 0 1   ?Feeling bad or failure about yourself  1 1   ?Trouble concentrating 1 1   ?Moving slowly or fidgety/restless 1 2   ?Suicidal thoughts 0 0   ?PHQ-9 Score 10 10   ?Difficult doing work/chores Somewhat difficult Somewhat difficult   ? ? ?History ?Angela Hull has a past medical history of Blurred vision, Hypertension, Manic depressive disorder (HCC), Multiple sclerosis (HCC), and PONV (postoperative nausea and vomiting).  ? ?She has a past surgical history that includes Cesarean section; Spine surgery; Uterine Ablation; and Incision and drainage (Right, 05/21/2018).  ? ?Her family history includes Asthma in her maternal grandmother.She reports that she has been smoking cigarettes. She has been smoking an average of .5 packs per day. She has never used smokeless tobacco. She reports current alcohol use. She reports that she does not use drugs. ? ? ? ?ROS ?Review of Systems ?Neg except as per HPI ?Objective:  ?BP 125/76   Pulse 79   Temp 98.1 ?F (36.7 ?C)   Ht 5\' 7"  (1.702 m)   Wt 166 lb 6.4 oz (75.5 kg)   SpO2 98%   BMI 26.06 kg/m?  ? ?BP Readings from Last 3 Encounters:  ?07/17/21 125/76   ?12/25/20 135/81  ?12/20/20 (!) 133/91  ? ? ?Wt Readings from Last 3 Encounters:  ?07/17/21 166 lb 6.4 oz (75.5 kg)  ?12/25/20 168 lb (76.2 kg)  ?12/20/20 164 lb 3.2 oz (74.5 kg)  ? ? ? ?Physical Exam ?Constitutional:   ?   Appearance: She is well-developed.  ?HENT:  ?   Head: Normocephalic.  ?Cardiovascular:  ?   Rate and Rhythm: Normal rate and regular rhythm.  ?   Heart sounds: No murmur heard. ?Pulmonary:  ?   Effort: Pulmonary effort is normal.  ?   Breath sounds: Normal breath sounds.  ?Musculoskeletal:     ?   General: Tenderness (at left lateral malleolus. FROM, but tender for inversion. NV intact LLE. No edema or erythema noted above the ankle. Nontender at calf.) present.  ? ? ? ? ? ?Assessment & Plan:  ? ?Angela Hull was seen today for edema. ? ?Diagnoses and all orders for this visit: ? ?Cellulitis of left lower extremity ? ?Acute left ankle pain ? ?Other orders ?-     Discontinue: amoxicillin (AMOXIL) 500 MG tablet; Take 4 tablets (2,000 mg total) by mouth once as needed for up to 1 dose (one hour before dental procedure). ?-     amoxicillin (AMOXIL) 500 MG capsule;  Take 1 capsule (500 mg total) by mouth 3 (three) times daily. ? ? ? ? ? ? ?I have discontinued Angela Hull's amoxicillin. I am also having her start on amoxicillin. Additionally, I am having her maintain her busPIRone, ketoconazole, naproxen, hydrochlorothiazide, amLODipine, traZODone, QUEtiapine, citalopram, and cariprazine. ? ?Allergies as of 07/17/2021   ?No Known Allergies ?  ? ?  ?Medication List  ?  ? ?  ? Accurate as of Jul 17, 2021  4:32 PM. If you have any questions, ask your nurse or doctor.  ?  ?  ? ?  ? ?amLODipine 10 MG tablet ?Commonly known as: NORVASC ?Take 1 tablet (10 mg total) by mouth daily. ?  ?amoxicillin 500 MG capsule ?Commonly known as: AMOXIL ?Take 1 capsule (500 mg total) by mouth 3 (three) times daily. ?Started by: Mechele Claude, MD ?  ?busPIRone 15 MG tablet ?Commonly known as: BUSPAR ?TAKE  (1)  TABLET  THREE TIMES  DAILY. ?  ?cariprazine 1.5 MG capsule ?Commonly known as: Vraylar ?Take 1 capsule (1.5 mg total) by mouth daily. ?  ?citalopram 40 MG tablet ?Commonly known as: CELEXA ?TAKE 1 TABLET ONCE DAILY ?  ?hydrochlorothiazide 25 MG tablet ?Commonly known as: HYDRODIURIL ?Take 1 tablet (25 mg total) by mouth daily. ?  ?ketoconazole 2 % cream ?Commonly known as: NIZORAL ?Apply 1 application topically daily. To rash under breast x 1-2 weeks ?  ?naproxen 500 MG tablet ?Commonly known as: Naprosyn ?Take 1 tablet (500 mg total) by mouth 2 (two) times daily with a meal. ?  ?QUEtiapine 50 MG tablet ?Commonly known as: SEROquel ?Take 1 tablet (50 mg total) by mouth at bedtime. ?  ?traZODone 50 MG tablet ?Commonly known as: DESYREL ?TAKE 1 TO 1&1/2 TABLETS AT BEDTIME AS NEEDED FOR SLEEP ?  ? ?  ? ?Pt. To wear brace. (ASO fitted) OTC meds for pain. Abx as ordered. Ice, elevate for swelling, comfort. ? ?Follow-up: Return if symptoms worsen or fail to improve. ? ?Mechele Claude, M.D. ?

## 2021-07-18 ENCOUNTER — Other Ambulatory Visit: Payer: Self-pay

## 2021-07-18 ENCOUNTER — Other Ambulatory Visit (INDEPENDENT_AMBULATORY_CARE_PROVIDER_SITE_OTHER): Payer: 59

## 2021-07-18 DIAGNOSIS — L03116 Cellulitis of left lower limb: Secondary | ICD-10-CM

## 2021-07-18 DIAGNOSIS — M7732 Calcaneal spur, left foot: Secondary | ICD-10-CM | POA: Diagnosis not present

## 2021-07-18 DIAGNOSIS — M25572 Pain in left ankle and joints of left foot: Secondary | ICD-10-CM | POA: Diagnosis not present

## 2021-07-20 ENCOUNTER — Telehealth: Payer: Self-pay | Admitting: Family Medicine

## 2021-07-23 NOTE — Telephone Encounter (Signed)
The x-ray turned out normal. Nothing broken, nothing dislocated. No arthritis ?

## 2021-07-23 NOTE — Telephone Encounter (Signed)
NA

## 2021-08-11 IMAGING — MG DIGITAL DIAGNOSTIC BILAT W/ TOMO W/ CAD
8 of 14 series · 8 of 40 positions shown · non-contrast
Comparison: None.

CLINICAL DATA: Palpable abnormality in the LEFT breast. Patient
describes pea-sized masses in the LOWER portion of the LEFT breast.

EXAM:
DIGITAL DIAGNOSTIC BILATERAL MAMMOGRAM WITH CAD AND TOMO
ULTRASOUND LEFT BREAST

[L LMO synth-2D]
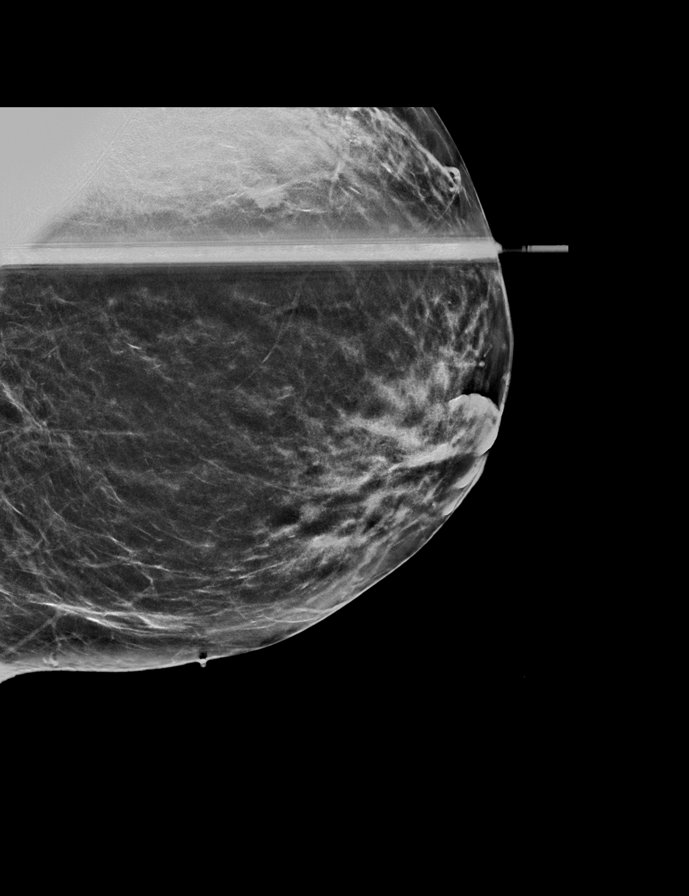

[L CC synth-2D (1 of 2)]
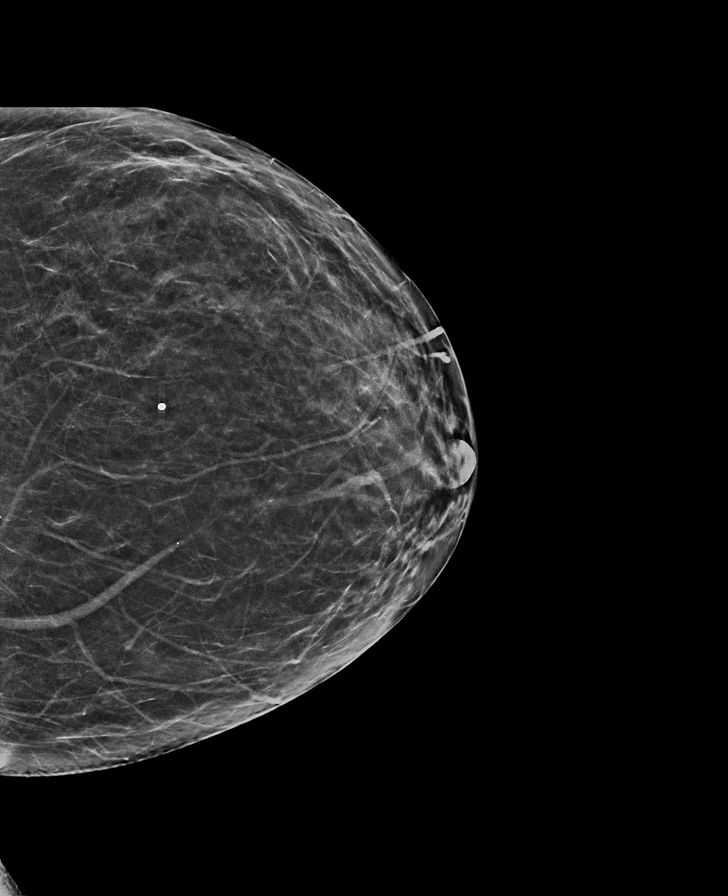

[R MLO synth-2D]
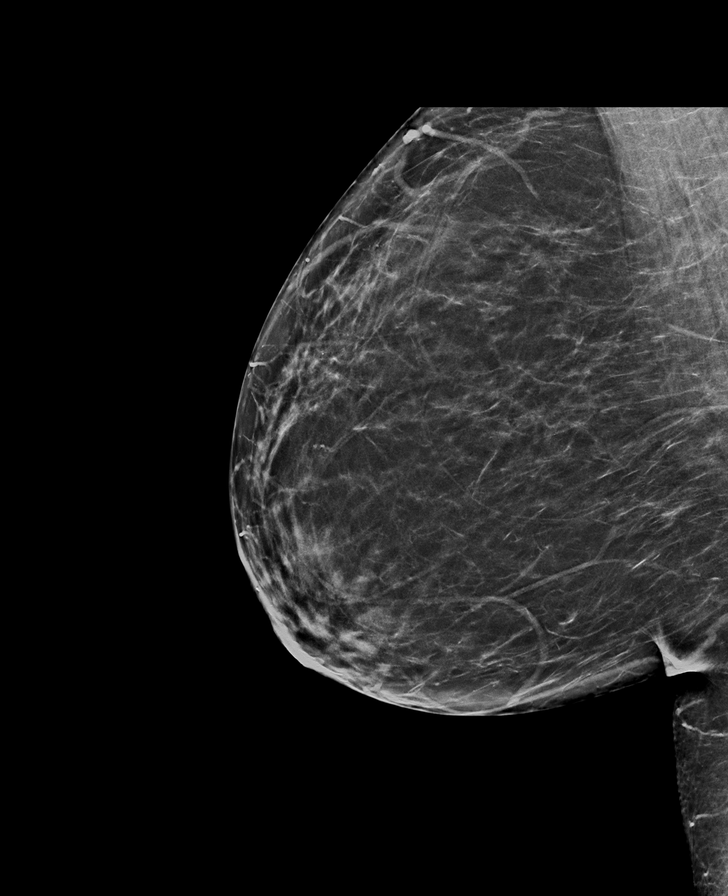

[L CC synth-2D (2 of 2)]
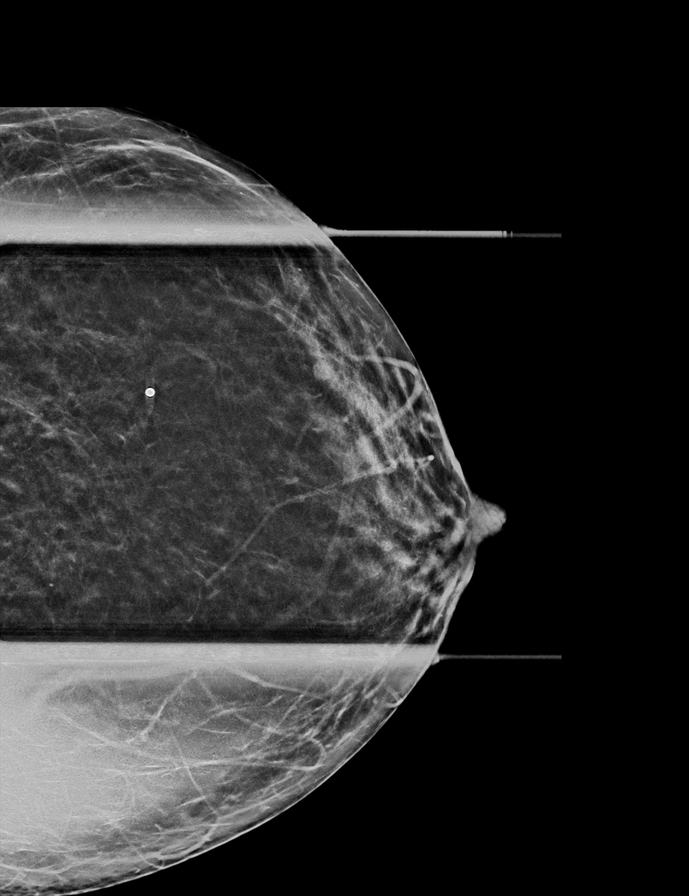

[L MLO synth-2D (1 of 2)]
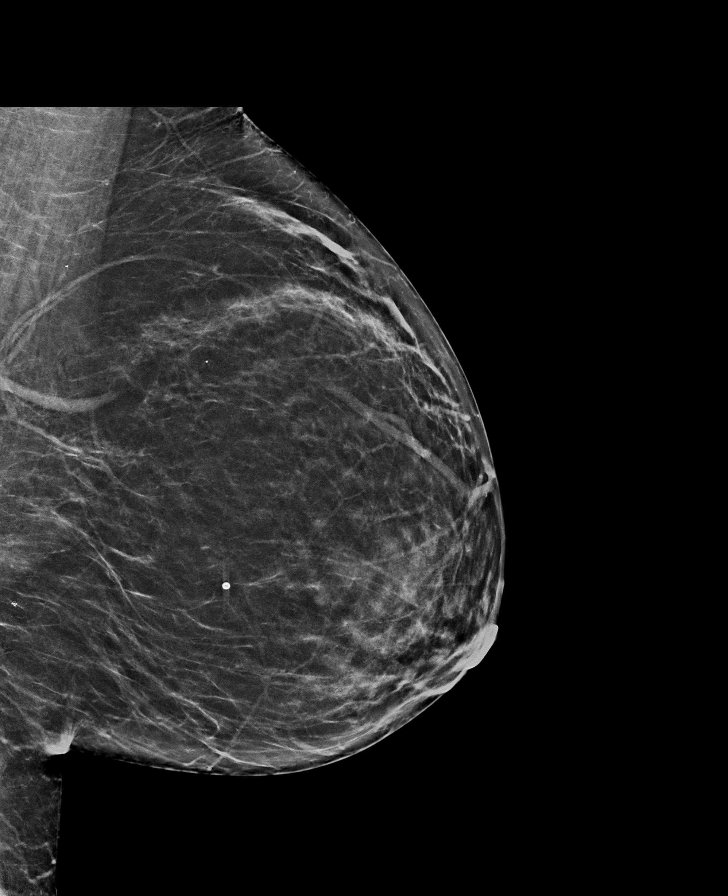

[L MLO synth-2D (2 of 2)]
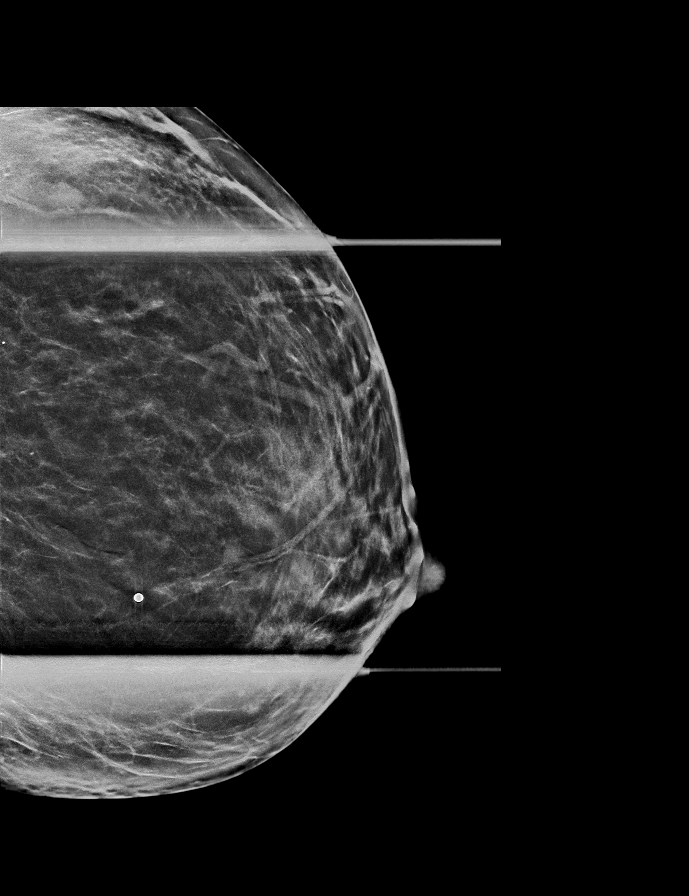

[R CC synth-2D]
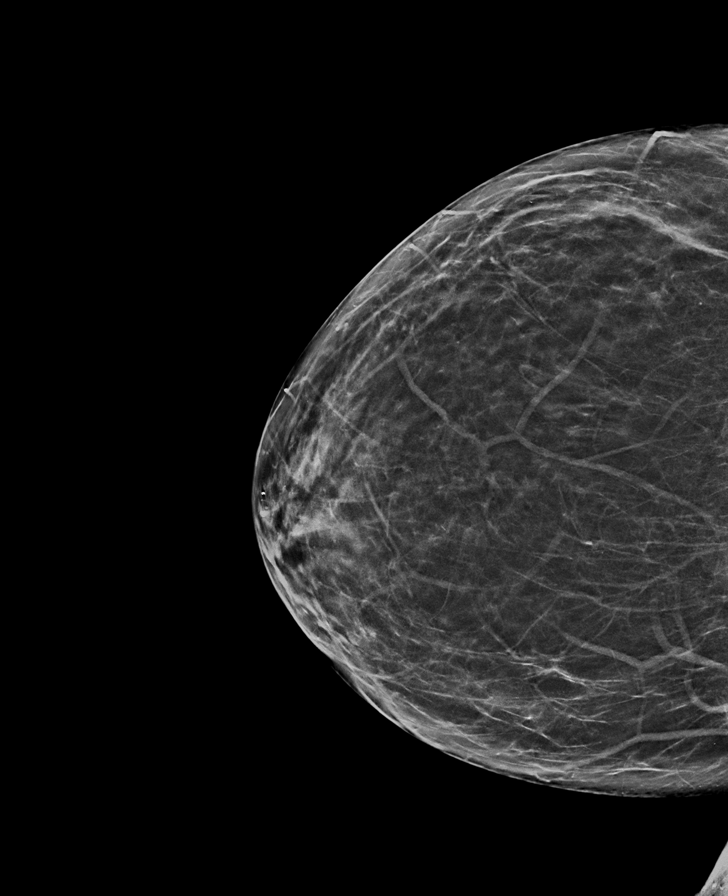

[L CC tomo · tomo slice 31/60.0]
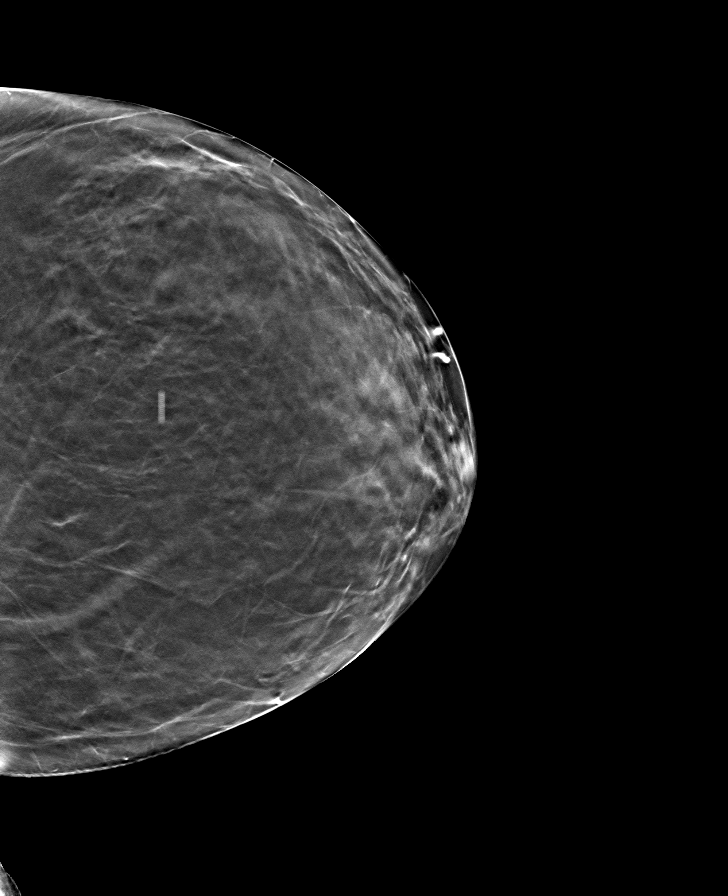

[8 of 40 positions shown; findings below may reference images not displayed]

ACR Breast Density Category b: There are scattered areas of
fibroglandular density.
FINDINGS: RIGHT breast is negative.

LATERAL to the LEFT nipple, a circumscribed oval mass is identified
and further evaluated with ultrasound. Spot tangential view in the
area of patient's concern shows no other abnormality.

Mammographic images were processed with CAD.

On physical exam, I palpate no discrete mass in the LOWER portions
of the LEFT breast.

Targeted ultrasound is performed, showing a an oval hypoechoic
circumscribed mass in the 3 o'clock location LEFT breast 2
centimeters from the nipple which measures 0.7 x 0.5 x
centimeters. There is associated posterior acoustic enhancement. No
internal blood flow identified on Doppler evaluation.
IMPRESSION: 1.  No mammographic or ultrasound evidence for malignancy.
2. No imaging abnormality in the area of patient's concern in the
LOWER portions of the LEFT breast.
3. Probably benign minimally complicated cyst in the 3 o'clock
location of the LEFT breast warrants follow-up. We discussed
management options including excision, biopsy, and close follow-up.
Imaging followup is recommended at 6, 12, and 24 months to assess
stability. The patient concurs with this plan.

RECOMMENDATION:
Recommend LEFT breast ultrasound in 6 months.

I have discussed the findings and recommendations with the patient.
If applicable, a reminder letter will be sent to the patient
regarding the next appointment.

BI-RADS CATEGORY  3: Probably benign.

## 2021-08-31 ENCOUNTER — Ambulatory Visit (INDEPENDENT_AMBULATORY_CARE_PROVIDER_SITE_OTHER): Payer: 59 | Admitting: Family Medicine

## 2021-08-31 ENCOUNTER — Encounter: Payer: Self-pay | Admitting: Family Medicine

## 2021-08-31 DIAGNOSIS — J0101 Acute recurrent maxillary sinusitis: Secondary | ICD-10-CM | POA: Diagnosis not present

## 2021-08-31 MED ORDER — AMOXICILLIN-POT CLAVULANATE 875-125 MG PO TABS: 1.0000 | ORAL_TABLET | Freq: Two times a day (BID) | ORAL | 0 refills | Status: AC

## 2021-09-28 ENCOUNTER — Other Ambulatory Visit: Payer: Self-pay | Admitting: Family Medicine

## 2021-09-28 DIAGNOSIS — F5104 Psychophysiologic insomnia: Secondary | ICD-10-CM

## 2021-09-28 NOTE — Telephone Encounter (Signed)
Last office visit 12/25/20 Last refill 11/14/20, #90, 3 refills

## 2021-11-30 ENCOUNTER — Other Ambulatory Visit: Payer: Self-pay | Admitting: Family Medicine

## 2021-11-30 DIAGNOSIS — I10 Essential (primary) hypertension: Secondary | ICD-10-CM

## 2022-01-02 ENCOUNTER — Encounter: Payer: 59 | Admitting: Family Medicine

## 2022-01-29 ENCOUNTER — Other Ambulatory Visit: Payer: Self-pay | Admitting: Family Medicine

## 2022-01-29 DIAGNOSIS — I10 Essential (primary) hypertension: Secondary | ICD-10-CM

## 2022-02-06 ENCOUNTER — Telehealth (INDEPENDENT_AMBULATORY_CARE_PROVIDER_SITE_OTHER): Payer: 59 | Admitting: Family Medicine

## 2022-02-06 ENCOUNTER — Encounter: Payer: Self-pay | Admitting: Family Medicine

## 2022-02-06 DIAGNOSIS — J0110 Acute frontal sinusitis, unspecified: Secondary | ICD-10-CM | POA: Diagnosis not present

## 2022-02-06 MED ORDER — FLUTICASONE PROPIONATE 50 MCG/ACT NA SUSP: 2.0000 | Freq: Every day | NASAL | 6 refills | Status: DC

## 2022-02-06 MED ORDER — AMOXICILLIN-POT CLAVULANATE 875-125 MG PO TABS: 1.0000 | ORAL_TABLET | Freq: Two times a day (BID) | ORAL | 0 refills | Status: AC

## 2022-02-06 NOTE — Progress Notes (Signed)
Virtual Visit via MyChart Video Note Due to COVID-19 pandemic this visit was conducted virtually. This visit type was conducted due to national recommendations for restrictions regarding the COVID-19 Pandemic (e.g. social distancing, sheltering in place) in an effort to limit this patient's exposure and mitigate transmission in our community. All issues noted in this document were discussed and addressed.  A physical exam was not performed with this format.   I connected with Angela Hull on 02/06/2022 at 1345 by MyChart Video and verified that I am speaking with the correct person using two identifiers. Angela Hull is currently located at home and patient is currently with them during visit. The provider, Kari Baars, FNP is located in their office at time of visit.  I discussed the limitations, risks, security and privacy concerns of performing an evaluation and management service by virtual visit and the availability of in person appointments. I also discussed with the patient that there may be a patient responsible charge related to this service. The patient expressed understanding and agreed to proceed.  Subjective:  Patient ID: Angela Hull, female    DOB: 1975/10/10, 46 y.o.   MRN: [id number]  Chief Complaint:  Sinusitis   HPI: Angela Hull is a 46 y.o. female presenting on 02/06/2022 for Sinusitis   Sinusitis This is a recurrent problem. Episode onset: 3 weeks ago. The problem has been gradually worsening since onset. The pain is moderate. Associated symptoms include chills, congestion, coughing, ear pain, headaches and sinus pressure. Pertinent negatives include no diaphoresis, hoarse voice, neck pain, shortness of breath, sneezing, sore throat or swollen glands. Past treatments include acetaminophen, oral decongestants and saline nose sprays. The treatment provided no relief.     Relevant past medical, surgical, family, and social history reviewed and updated as indicated.   Allergies and medications reviewed and updated.   Past Medical History:  Diagnosis Date   Blurred vision    Hypertension    Manic depressive disorder (HCC)    Multiple sclerosis (HCC)    PONV (postoperative nausea and vomiting)     Past Surgical History:  Procedure Laterality Date   CESAREAN SECTION     INCISION AND DRAINAGE Right 05/21/2018   Procedure: INCISION AND DRAINAGE OF RIGHT INDEX FINGER AND THUMB;  Surgeon: Cindee Salt, MD;  Location: [facility];  Service: Orthopedics;  Laterality: Right;  FAB   SPINE SURGERY     Uterine Ablation      Social History   Socioeconomic History   Marital status: Married    Spouse name: Not on file   Number of children: Not on file   Years of education: Not on file   Highest education level: Not on file  Occupational History   Not on file  Tobacco Use   Smoking status: Every Day    Packs/day: 0.50    Types: Cigarettes   Smokeless tobacco: Never  Vaping Use   Vaping Use: Never used  Substance and Sexual Activity   Alcohol use: Yes    Comment: Occasional   Drug use: No   Sexual activity: Yes  Other Topics Concern   Not on file  Social History Narrative   Not on file   Social Determinants of Health   Financial Resource Strain: Not on file  Food Insecurity: Not on file  Transportation Needs: Not on file  Physical Activity: Not on file  Stress: Not on file  Social Connections: Not on file  Intimate Partner Violence: Not on file  Outpatient Encounter Medications as of 02/06/2022  Medication Sig   amoxicillin-clavulanate (AUGMENTIN) 875-125 MG tablet Take 1 tablet by mouth 2 (two) times daily for 10 days.   fluticasone (FLONASE) 50 MCG/ACT nasal spray Place 2 sprays into both nostrils daily.   amLODipine (NORVASC) 10 MG tablet TAKE 1 TABLET DAILY   busPIRone (BUSPAR) 15 MG tablet TAKE  (1)  TABLET  THREE TIMES DAILY.   cariprazine (VRAYLAR) 1.5 MG capsule Take 1 capsule (1.5 mg total) by mouth daily.    citalopram (CELEXA) 40 MG tablet TAKE 1 TABLET ONCE DAILY   hydrochlorothiazide (HYDRODIURIL) 25 MG tablet Take 1 tablet (25 mg total) by mouth daily. (NEEDS TO BE SEEN BEFORE NEXT REFILL)   ketoconazole (NIZORAL) 2 % cream Apply 1 application topically daily. To rash under breast x 1-2 weeks   naproxen (NAPROSYN) 500 MG tablet Take 1 tablet (500 mg total) by mouth 2 (two) times daily with a meal.   QUEtiapine (SEROQUEL) 50 MG tablet Take 1 tablet (50 mg total) by mouth at bedtime.   traZODone (DESYREL) 50 MG tablet TAKE 1 TO 1&1/2 TABLETS AT BEDTIME AS NEEDED FOR SLEEP   No facility-administered encounter medications on file as of 02/06/2022.    No Known Allergies  Review of Systems  Constitutional:  Positive for activity change, appetite change, chills and fatigue. Negative for diaphoresis, fever and unexpected weight change.  HENT:  Positive for congestion, ear pain, postnasal drip, rhinorrhea, sinus pressure and sinus pain. Negative for dental problem, drooling, ear discharge, facial swelling, hearing loss, hoarse voice, mouth sores, nosebleeds, sneezing, sore throat, tinnitus, trouble swallowing and voice change.   Respiratory:  Positive for cough. Negative for apnea, choking, chest tightness, shortness of breath, wheezing and stridor.   Cardiovascular:  Negative for chest pain, palpitations and leg swelling.  Gastrointestinal:  Negative for abdominal pain.  Genitourinary:  Negative for decreased urine volume and difficulty urinating.  Musculoskeletal:  Negative for neck pain.  Neurological:  Positive for headaches. Negative for dizziness, tremors, seizures, syncope, facial asymmetry, speech difficulty, weakness, light-headedness and numbness.  Psychiatric/Behavioral:  Negative for confusion.   All other systems reviewed and are negative.        Observations/Objective: No vital signs or physical exam, this was a virtual health encounter.  Pt alert and oriented, answers all  questions appropriately, and able to speak in full sentences.    Assessment and Plan: Carilyn was seen today for sinusitis.  Diagnoses and all orders for this visit:  Acute non-recurrent frontal sinusitis Worsening over the last 2 weeks despite symptomatic care at home. Will add flonase and augmentin to regimen. Increase water intake. Aware to report new, worsening, or persistent symptoms.  -     fluticasone (FLONASE) 50 MCG/ACT nasal spray; Place 2 sprays into both nostrils daily. -     amoxicillin-clavulanate (AUGMENTIN) 875-125 MG tablet; Take 1 tablet by mouth 2 (two) times daily for 10 days.     Follow Up Instructions: Return if symptoms worsen or fail to improve.    I discussed the assessment and treatment plan with the patient. The patient was provided an opportunity to ask questions and all were answered. The patient agreed with the plan and demonstrated an understanding of the instructions.   The patient was advised to call back or seek an in-person evaluation if the symptoms worsen or if the condition fails to improve as anticipated.  The above assessment and management plan was discussed with the patient. The patient verbalized understanding  of and has agreed to the management plan. Patient is aware to call the clinic if they develop any new symptoms or if symptoms persist or worsen. Patient is aware when to return to the clinic for a follow-up visit. Patient educated on when it is appropriate to go to the emergency department.    I provided 12 minutes of time during this MyChart Video encounter.   Kari Baars, FNP-C [facility] [address] [place], [place] [address] [phone] 02/06/2022

## 2022-02-13 ENCOUNTER — Other Ambulatory Visit: Payer: Self-pay | Admitting: Family Medicine

## 2022-02-13 DIAGNOSIS — I10 Essential (primary) hypertension: Secondary | ICD-10-CM

## 2022-04-02 ENCOUNTER — Other Ambulatory Visit: Payer: Self-pay | Admitting: Family Medicine

## 2022-04-02 DIAGNOSIS — I10 Essential (primary) hypertension: Secondary | ICD-10-CM

## 2022-04-05 ENCOUNTER — Telehealth: Payer: Self-pay | Admitting: Family Medicine

## 2022-04-05 DIAGNOSIS — G35 Multiple sclerosis: Secondary | ICD-10-CM

## 2022-04-05 DIAGNOSIS — I10 Essential (primary) hypertension: Secondary | ICD-10-CM

## 2022-04-05 DIAGNOSIS — F319 Bipolar disorder, unspecified: Secondary | ICD-10-CM

## 2022-04-05 NOTE — Telephone Encounter (Signed)
Orders Placed This Encounter  Procedures   CMP14+EGFR    Standing Status:   Future    Standing Expiration Date:   04/06/2023   Lipid panel    Standing Status:   Future    Standing Expiration Date:   04/06/2023   TSH    Standing Status:   Future    Standing Expiration Date:   04/06/2023   CBC    Standing Status:   Future    Standing Expiration Date:   04/06/2023

## 2022-04-05 NOTE — Telephone Encounter (Signed)
Patient aware

## 2022-04-08 ENCOUNTER — Other Ambulatory Visit: Payer: 59

## 2022-04-08 DIAGNOSIS — G35 Multiple sclerosis: Secondary | ICD-10-CM | POA: Diagnosis not present

## 2022-04-08 DIAGNOSIS — F319 Bipolar disorder, unspecified: Secondary | ICD-10-CM

## 2022-04-08 DIAGNOSIS — I10 Essential (primary) hypertension: Secondary | ICD-10-CM

## 2022-04-08 DIAGNOSIS — R69 Illness, unspecified: Secondary | ICD-10-CM | POA: Diagnosis not present

## 2022-04-09 ENCOUNTER — Encounter: Payer: 59 | Admitting: Family Medicine

## 2022-04-09 LAB — CMP14+EGFR
ALT: 13 IU/L (ref 0–32)
AST: 22 IU/L (ref 0–40)
Albumin/Globulin Ratio: 2 (ref 1.2–2.2)
Albumin: 4.7 g/dL (ref 3.9–4.9)
Alkaline Phosphatase: 73 IU/L (ref 44–121)
BUN/Creatinine Ratio: 17 (ref 9–23)
BUN: 12 mg/dL (ref 6–24)
Bilirubin Total: <0.2 mg/dL (ref 0.0–1.2)
CO2: 22 mmol/L (ref 20–29)
Calcium: 9.3 mg/dL (ref 8.7–10.2)
Chloride: 101 mmol/L (ref 96–106)
Creatinine, Ser: 0.71 mg/dL (ref 0.57–1.00)
Globulin, Total: 2.4 g/dL (ref 1.5–4.5)
Glucose: 96 mg/dL (ref 70–99)
Potassium: 4.1 mmol/L (ref 3.5–5.2)
Sodium: 137 mmol/L (ref 134–144)
Total Protein: 7.1 g/dL (ref 6.0–8.5)
eGFR: 107 mL/min/{1.73_m2} (ref >59)

## 2022-04-09 LAB — CBC
Hematocrit: 42.2 % (ref 34.0–46.6)
Hemoglobin: 14.1 g/dL (ref 11.1–15.9)
MCH: 33.3 pg — ABNORMAL HIGH (ref 26.6–33.0)
MCHC: 33.4 g/dL (ref 31.5–35.7)
MCV: 100 fL — ABNORMAL HIGH (ref 79–97)
Platelets: 363 10*3/uL (ref 150–450)
RBC: 4.23 x10E6/uL (ref 3.77–5.28)
RDW: 11.5 % — ABNORMAL LOW (ref 11.7–15.4)
WBC: 9.1 10*3/uL (ref 3.4–10.8)

## 2022-04-09 LAB — LIPID PANEL
Chol/HDL Ratio: 3 ratio (ref 0.0–4.4)
Cholesterol, Total: 247 mg/dL — ABNORMAL HIGH (ref 100–199)
HDL: 81 mg/dL (ref >39)
LDL Chol Calc (NIH): 148 mg/dL — ABNORMAL HIGH (ref 0–99)
Triglycerides: 105 mg/dL (ref 0–149)
VLDL Cholesterol Cal: 18 mg/dL (ref 5–40)

## 2022-04-09 LAB — TSH: TSH: 1.93 u[IU]/mL (ref 0.450–4.500)

## 2022-04-23 ENCOUNTER — Encounter: Payer: Self-pay | Admitting: Family Medicine

## 2022-04-23 ENCOUNTER — Ambulatory Visit (INDEPENDENT_AMBULATORY_CARE_PROVIDER_SITE_OTHER): Payer: 59 | Admitting: Family Medicine

## 2022-04-23 VITALS — BP 150/88 | HR 74 | Temp 98.6°F | Ht 67.0 in | Wt 177.0 lb

## 2022-04-23 DIAGNOSIS — R69 Illness, unspecified: Secondary | ICD-10-CM | POA: Diagnosis not present

## 2022-04-23 DIAGNOSIS — F411 Generalized anxiety disorder: Secondary | ICD-10-CM

## 2022-04-23 DIAGNOSIS — Z6827 Body mass index (BMI) 27.0-27.9, adult: Secondary | ICD-10-CM | POA: Diagnosis not present

## 2022-04-23 DIAGNOSIS — I10 Essential (primary) hypertension: Secondary | ICD-10-CM

## 2022-04-23 DIAGNOSIS — Z0001 Encounter for general adult medical examination with abnormal findings: Secondary | ICD-10-CM

## 2022-04-23 DIAGNOSIS — E663 Overweight: Secondary | ICD-10-CM | POA: Diagnosis not present

## 2022-04-23 DIAGNOSIS — G35 Multiple sclerosis: Secondary | ICD-10-CM | POA: Diagnosis not present

## 2022-04-23 DIAGNOSIS — F319 Bipolar disorder, unspecified: Secondary | ICD-10-CM

## 2022-04-23 DIAGNOSIS — Z720 Tobacco use: Secondary | ICD-10-CM | POA: Diagnosis not present

## 2022-04-23 DIAGNOSIS — F5104 Psychophysiologic insomnia: Secondary | ICD-10-CM

## 2022-04-23 DIAGNOSIS — Z0000 Encounter for general adult medical examination without abnormal findings: Secondary | ICD-10-CM

## 2022-04-23 MED ORDER — HYDROCHLOROTHIAZIDE 25 MG PO TABS: 25.0000 mg | ORAL_TABLET | Freq: Every day | ORAL | 3 refills | Status: DC

## 2022-04-23 MED ORDER — TRAZODONE HCL 50 MG PO TABS: ORAL_TABLET | ORAL | 3 refills | Status: DC

## 2022-04-23 MED ORDER — CITALOPRAM HYDROBROMIDE 40 MG PO TABS: 40.0000 mg | ORAL_TABLET | Freq: Every day | ORAL | 3 refills | Status: DC

## 2022-04-23 MED ORDER — AMLODIPINE BESYLATE 10 MG PO TABS: 10.0000 mg | ORAL_TABLET | Freq: Every day | ORAL | 3 refills | Status: DC

## 2022-04-23 NOTE — Progress Notes (Signed)
Angela Hull is a 47 y.o. female presents to office today for annual physical exam examination.    Concerns today include: 1. BMI She reports frustration because she has been gaining weight.  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  She switched jobs and is working across the street from [employer] with a [employer].  She is doing Water engineer for them and is very happy with her new work schedule and job responsibilities.  She often gets 3-day weekends and has more time to spend with her family and doing things that she enjoys.  She has never been on any over-the-counter supplements or taking any prescription medication except for Wellbutrin.  She is interested in possibly doing a GLP.  She has no contraindications to use personally or in the family.  Lifestyle modifications have failed to get her back to where she was previously  She does note that since changing jobs her mood has been a lot better.  She did not tolerate Seroquel of note and feels like mood is stable off of Vraylar.  BuSpar was ineffective.  Occupation: works for [employer], Marital status: married, Substance use: smokes ,1ppd. Working on cessation Diet: typical [place], Exercise: Does not exercise as much as she had been and her new job is mostly sedentary Last mammogram: due Last pap smear: UTD Refills needed today: all Immunizations needed: Declines vaccination  Immunization History  Administered Date(s) Administered   Influenza,inj,Quad PF,6+ Mos 12/31/2017, 12/25/2018   Moderna Sars-Covid-2 Vaccination 05/25/2019, 06/22/2019, 02/08/2020   Td 05/12/2018   Tdap 05/12/2018     Past Medical History:  Diagnosis Date   Blurred vision    Hypertension    Manic depressive disorder ([place])    Multiple sclerosis ([place])    PONV (postoperative nausea and vomiting)    Social History   Socioeconomic History    Marital status: Married    Spouse name: Not on file   Number of children: Not on file   Years of education: Not on file   Highest education level: Not on file  Occupational History   Not on file  Tobacco Use   Smoking status: Every Day    Packs/day: 0.50    Types: Cigarettes   Smokeless tobacco: Never  Vaping Use   Vaping Use: Never used  Substance and Sexual Activity   Alcohol use: Yes    Comment: Occasional   Drug use: No   Sexual activity: Yes  Other Topics Concern   Not on file  Social History Narrative   Not on file   Social Determinants of Health   Financial Resource Strain: Not on file  Food Insecurity: Not on file  Transportation Needs: Not on file  Physical Activity: Not on file  Stress: Not on file  Social Connections: Not on file  Intimate Partner Violence: Not on file   Past Surgical History:  Procedure Laterality Date   CESAREAN SECTION     INCISION AND DRAINAGE Right 05/21/2018   Procedure: INCISION AND DRAINAGE OF RIGHT INDEX FINGER AND THUMB;  Surgeon: Daryll Brod, MD;  Location: [facility];  Service: Orthopedics;  Laterality: Right;  FAB   SPINE SURGERY     Uterine Ablation     Family History  Problem Relation Age of Onset   Asthma Maternal Grandmother     Current Outpatient Medications:    amLODipine (NORVASC) 10 MG tablet,  TAKE 1 TABLET DAILY, Disp: 30 tablet, Rfl: 1   busPIRone (BUSPAR) 15 MG tablet, TAKE  (1)  TABLET  THREE TIMES DAILY., Disp: 270 tablet, Rfl: 3   cariprazine (VRAYLAR) 1.5 MG capsule, Take 1 capsule (1.5 mg total) by mouth daily., Disp: 90 capsule, Rfl: 3   citalopram (CELEXA) 40 MG tablet, TAKE 1 TABLET ONCE DAILY, Disp: 90 tablet, Rfl: 3   fluticasone (FLONASE) 50 MCG/ACT nasal spray, Place 2 sprays into both nostrils daily., Disp: 16 g, Rfl: 6   hydrochlorothiazide (HYDRODIURIL) 25 MG tablet, TAKE ONE TABLET DAILY, Disp: 30 tablet, Rfl: 0   ketoconazole (NIZORAL) 2 % cream, Apply 1 application topically  daily. To rash under breast x 1-2 weeks, Disp: 30 g, Rfl: 0   naproxen (NAPROSYN) 500 MG tablet, Take 1 tablet (500 mg total) by mouth 2 (two) times daily with a meal., Disp: 30 tablet, Rfl: 1   QUEtiapine (SEROQUEL) 50 MG tablet, Take 1 tablet (50 mg total) by mouth at bedtime., Disp: 90 tablet, Rfl: 0   traZODone (DESYREL) 50 MG tablet, TAKE 1 TO 1&1/2 TABLETS AT BEDTIME AS NEEDED FOR SLEEP, Disp: 90 tablet, Rfl: 3  No Known Allergies   ROS: Review of Systems Pertinent items noted in HPI and remainder of comprehensive ROS otherwise negative.    Physical exam BP (!) 150/88   Pulse 74   Temp 98.6 F (37 C)   Ht 5' 7"$  (1.702 m)   Wt 177 lb (80.3 kg)   SpO2 96%   BMI 27.72 kg/m  General appearance: alert, cooperative, appears stated age, and no distress Head: Normocephalic, without obvious abnormality, atraumatic Eyes: negative findings: lids and lashes normal, conjunctivae and sclerae normal, corneas clear, and pupils equal, round, reactive to light and accomodation Ears: normal TM's and external ear canals both ears Nose: Nares normal. Septum midline. Mucosa normal. No drainage or sinus tenderness. Throat: lips, mucosa, and tongue normal; teeth and gums normal Neck: no adenopathy, supple, symmetrical, trachea midline, and thyroid not enlarged, symmetric, no tenderness/mass/nodules Back: symmetric, no curvature. ROM normal. No CVA tenderness. Lungs: clear to auscultation bilaterally Heart: regular rate and rhythm, S1, S2 normal, no murmur, click, rub or gallop Abdomen: soft, non-tender; bowel sounds normal; no masses,  no organomegaly Extremities: extremities normal, atraumatic, no cyanosis or edema Pulses: 2+ and symmetric Skin: Skin color, texture, turgor normal. No rashes or lesions Lymph nodes: Cervical, supraclavicular, and axillary nodes normal. Neurologic: Grossly normal Psych: Mood stable, speech normal, affect appropriate.  Very pleasant and interactive     04/23/2022     3:08 PM 07/17/2021    5:00 PM 12/25/2020    1:57 PM  Depression screen PHQ 2/9  Decreased Interest 1 1 2  $ Down, Depressed, Hopeless 1 1 2  $ PHQ - 2 Score 2 2 4  $ Altered sleeping 1 0 2  Tired, decreased energy 1 1 1  $ Change in appetite 1 1 0  Feeling bad or failure about yourself  0 1 1  Trouble concentrating 0 1 1  Moving slowly or fidgety/restless 0 1 1  Suicidal thoughts 0 0 0  PHQ-9 Score 5 7 10  $ Difficult doing work/chores Somewhat difficult Not difficult at all Somewhat difficult      04/23/2022    3:07 PM 07/17/2021    5:00 PM 12/25/2020    1:58 PM 12/20/2020    9:03 AM  GAD 7 : Generalized Anxiety Score  Nervous, Anxious, on Edge 1 1 2 2  $ Control/stop worrying 1  $1 2 2  M$ Worry too much - different things 1 1 2 2  $ Trouble relaxing 0 1 2 2  $ Restless 0 1 1 2  $ Easily annoyed or irritable 0 1 1 2  $ Afraid - awful might happen 0 1 1 2  $ Total GAD 7 Score 3 7 11 14  $ Anxiety Difficulty Somewhat difficult Not difficult at all Somewhat difficult Somewhat difficult    Assessment/ Plan: Angela Hull here for annual physical exam.   Annual physical exam  Overweight (BMI 25.0-29.9)  Essential hypertension - Plan: amLODipine (NORVASC) 10 MG tablet, hydrochlorothiazide (HYDRODIURIL) 25 MG tablet  Multiple sclerosis (HCC)  Bipolar depression (HCC)  Generalized anxiety disorder - Plan: citalopram (CELEXA) 40 MG tablet  Psychophysiological insomnia - Plan: traZODone (DESYREL) 50 MG tablet  Tobacco use  I counseled her on options for medical weight loss though I am not sure her insurance will cover it without having failed other modalities of treatment.  She will investigate as to whether or not her insurance covers [place] or that bound.  We discussed the possible side effects of this medication and contraindications to use.  Blood pressure was not well-controlled despite compliance with medications.  She will return in 2 to 4 weeks for blood pressure check with nurse if still  uncontrolled we may need to consider adding thiazide diuretic and or ACE inhibitor.  No reports of MS exacerbations.  She reports bipolar depression and anxiety disorder well-controlled with current regimen.  Trazodone working well for sleep and she is actively trying to come off of tobacco.  Counseled on healthy lifestyle choices, including diet (rich in fruits, vegetables and lean meats and low in salt and simple carbohydrates) and exercise (at least 30 minutes of moderate physical activity daily).  Patient to follow up in 1 year for annual exam or sooner if needed.  Diavian Furgason M. Lajuana Ripple, DO

## 2022-04-23 NOTE — Patient Instructions (Addendum)
Pap due 12/2023.  Wegovy Zepbound  Tips for success with Wegovy (and by success, how not to be super sick on your stomach): Eat small meals AVOID heavy foods (fried/ high in carbs like bread, pasta, rice) AVOID carbonated beverages (soda/ beer, as these can increase bloating) DOUBLE your water intake (will help you avoid constipation/ dehydration)  [id number] CAN cause: Nausea Abdominal pain Increased acid reflux (sometimes presents as "sour burps") Constipation OR Diarrhea Fatigue (especially when you first start it)

## 2023-01-20 ENCOUNTER — Ambulatory Visit: Payer: Self-pay | Admitting: Family Medicine

## 2023-01-20 NOTE — Telephone Encounter (Signed)
  Chief Complaint: bilateral ear fullness Symptoms: ear fullness, stuffy nose, cough Frequency: constant Pertinent Negatives: Patient denies ear drainage Disposition: [] ED /[] Urgent Care (no appt availability in office) / [x] Appointment(In office/virtual)/ []  [facility]/ [] Home Care/ [] Refused Recommended Disposition /[] [facility]/ []  Follow-up with PCP Additional Notes: Patient said she has recently been traveling by plane and is now having ear fullness that has progressively gotten worse. Patient will not be back in town until tomorrow morning. Patient was scheduled for appt 11/12.  Copied from CRM [id number]. Topic: Appointments - Appointment Scheduling >> Jan 20, 2023 12:43 PM Tiffany H wrote: Patient called to advise that she is traveling in from [place] with what she suspects is a sinus infection. Both ears are blocked. She's been feeling under the weather since Wednesday. Patient advised that she has a productive cough. Transferring to RN Triage. Reason for Disposition  Earache persists > 1 hour  Answer Assessment - Initial Assessment Questions 1. LOCATION: "Which ear is involved?"       both 2. SENSATION: "Describe how the ear feels." (e.g. stuffy, full, plugged)."      Fullness, plugged 3. ONSET:  "When did the ear symptoms start?"       yesterday 4. PAIN: "Do you also have an earache?" If Yes, ask: "How bad is it?" (Scale 1-10; or mild, moderate, severe)     severe 5. CAUSE: "What do you think is causing the ear congestion?"     I've been on a plane so I think that's causing it 6. URI: "Do you have a runny nose or cough?"      Cough, runny nose 7. NASAL ALLERGIES: "Are there symptoms of hay fever, such as sneezing or a clear nasal discharge?"     Clear nasal discharge  Protocols used: Ear - Congestion-A-AH

## 2023-01-21 ENCOUNTER — Ambulatory Visit (INDEPENDENT_AMBULATORY_CARE_PROVIDER_SITE_OTHER): Payer: 59 | Admitting: Family Medicine

## 2023-01-21 ENCOUNTER — Encounter: Payer: Self-pay | Admitting: Family Medicine

## 2023-01-21 VITALS — BP 171/95 | HR 80 | Temp 98.5°F | Ht 67.0 in | Wt 180.0 lb

## 2023-01-21 DIAGNOSIS — J029 Acute pharyngitis, unspecified: Secondary | ICD-10-CM

## 2023-01-21 DIAGNOSIS — I10 Essential (primary) hypertension: Secondary | ICD-10-CM | POA: Diagnosis not present

## 2023-01-21 DIAGNOSIS — H65193 Other acute nonsuppurative otitis media, bilateral: Secondary | ICD-10-CM

## 2023-01-21 LAB — RAPID STREP SCREEN (MED CTR MEBANE ONLY): Strep Gp A Ag, IA W/Reflex: NEGATIVE

## 2023-01-21 LAB — CULTURE, GROUP A STREP

## 2023-01-21 MED ORDER — AMOXICILLIN-POT CLAVULANATE 875-125 MG PO TABS: 1.0000 | ORAL_TABLET | Freq: Two times a day (BID) | ORAL | 0 refills | Status: AC

## 2023-01-21 NOTE — Progress Notes (Signed)
Subjective:  Patient ID: Angela Hull, female    DOB: Sep 08, 1975, 47 y.o.   MRN: [id number]  Patient Care Team: Raliegh Ip, DO as PCP - General (Family Medicine)   Chief Complaint:  Ear Fullness (Bilateral ear fullness/Sore throat/Recent airline travel to [place]/)   HPI: Angela Hull is a 47 y.o. female presenting on 01/21/2023 for Ear Fullness (Bilateral ear fullness/Sore throat/Recent airline travel to [place]/)   Ear Fullness   1. Sore throat States that she "wore her body down" while working and traveling. Returned from [place] on Sunday. Saturday started feeling fatigue. Reports that one Sunday she started with sore throat, rhinorrhea clear, cough productive but clear, and watery eyes. Denies N/V/D. Has not checked for fever. States that she did feel flush on occasion. States that she has had AOM once as an adult. Reports that her "Throat is on fire" and her ears feel clogged. She is taking nyquil and dayquil. States that it helps slightly. She is also trying teas. Denies recent antibiotics.    Relevant past medical, surgical, family, and social history reviewed and updated as indicated.  Allergies and medications reviewed and updated. Data reviewed: Chart in Epic.   Past Medical History:  Diagnosis Date   Blurred vision    Hypertension    Manic depressive disorder (HCC)    Multiple sclerosis (HCC)    PONV (postoperative nausea and vomiting)     Past Surgical History:  Procedure Laterality Date   CESAREAN SECTION     INCISION AND DRAINAGE Right 05/21/2018   Procedure: INCISION AND DRAINAGE OF RIGHT INDEX FINGER AND THUMB;  Surgeon: Cindee Salt, MD;  [facility]  [facility];  Service: Orthopedics;  Laterality: Right;  FAB   SPINE SURGERY     Uterine Ablation      Social History   Socioeconomic History   Marital status: Married    Spouse name: Not on file   Number of children: Not on file   Years of education: Not on file   Highest  education level: Not on file  Occupational History   Not on file  Tobacco Use   Smoking status: Every Day    Current packs/day: 0.50    Types: Cigarettes   Smokeless tobacco: Never  Vaping Use   Vaping status: Never Used  Substance and Sexual Activity   Alcohol use: Yes    Comment: Occasional   Drug use: No   Sexual activity: Yes  Other Topics Concern   Not on file  Social History Narrative   Not on file   Social Determinants of Health   Financial Resource Strain: Not on file  Food Insecurity: Not on file  Transportation Needs: Not on file  Physical Activity: Not on file  Stress: Not on file  Social Connections: Not on file  Intimate Partner Violence: Not on file    Outpatient Encounter Medications as of 01/21/2023  Medication Sig   amLODipine (NORVASC) 10 MG tablet Take 1 tablet (10 mg total) by mouth daily.   citalopram (CELEXA) 40 MG tablet Take 1 tablet (40 mg total) by mouth daily.   hydrochlorothiazide (HYDRODIURIL) 25 MG tablet Take 1 tablet (25 mg total) by mouth daily.   traZODone (DESYREL) 50 MG tablet TAKE 1 TO 1&1/2 TABLETS AT BEDTIME AS NEEDED FOR SLEEP   fluticasone (FLONASE) 50 MCG/ACT nasal spray Place 2 sprays into both nostrils daily. (Patient not taking: Reported on 01/21/2023)   No facility-administered encounter medications on file as of  01/21/2023.    Allergies  Allergen Reactions   Seroquel [Quetiapine]     Caused delirium    Review of Systems As per HPI  Objective:  BP (!) 171/95   Pulse 80   Temp 98.5 F (36.9 C)   Ht 5\' 7"  (1.702 m)   Wt 180 lb (81.6 kg)   SpO2 96%   BMI 28.19 kg/m    Wt Readings from Last 3 Encounters:  01/21/23 180 lb (81.6 kg)  04/23/22 177 lb (80.3 kg)  07/17/21 166 lb 6.4 oz (75.5 kg)   Physical Exam Constitutional:      General: She is awake. She is not in acute distress.    Appearance: Normal appearance. She is well-developed and well-groomed. She is not ill-appearing, toxic-appearing or  diaphoretic.  HENT:     Right Ear: Decreased hearing noted. Swelling and tenderness present. No laceration or drainage. A middle ear effusion is present. There is no impacted cerumen. No foreign body. No mastoid tenderness. No PE tube. No hemotympanum. Tympanic membrane is injected, erythematous and bulging. Tympanic membrane is not scarred, perforated or retracted.     Left Ear: Decreased hearing noted. Swelling and tenderness present. No laceration or drainage. A middle ear effusion is present. There is no impacted cerumen. No foreign body. No mastoid tenderness. No PE tube. No hemotympanum. Tympanic membrane is injected, erythematous and bulging. Tympanic membrane is not scarred, perforated or retracted.     Nose: Congestion and rhinorrhea present. Rhinorrhea is clear.     Mouth/Throat:     Lips: Pink. No lesions.     Tongue: No lesions. Tongue does not deviate from midline.     Palate: No mass and lesions.     Pharynx: Posterior oropharyngeal erythema present. No pharyngeal swelling, oropharyngeal exudate, uvula swelling or postnasal drip.     Tonsils: Tonsillar exudate present. No tonsillar abscesses. 2+ on the right. 2+ on the left.      Comments: Single exudative lesion on left tonsil.  Cardiovascular:     Rate and Rhythm: Normal rate and regular rhythm.     Pulses: Normal pulses.          Radial pulses are 2+ on the right side and 2+ on the left side.       Posterior tibial pulses are 2+ on the right side and 2+ on the left side.     Heart sounds: Normal heart sounds. No murmur heard.    No gallop.  Pulmonary:     Effort: Pulmonary effort is normal. No respiratory distress.     Breath sounds: Normal breath sounds. No stridor. No wheezing, rhonchi or rales.  Musculoskeletal:     Cervical back: Full passive range of motion without pain and neck supple.     Right lower leg: No edema.     Left lower leg: No edema.  Lymphadenopathy:     Head:     Right side of head: Tonsillar  adenopathy present. No submental, submandibular, preauricular or posterior auricular adenopathy.     Left side of head: No submental, submandibular, tonsillar, preauricular or posterior auricular adenopathy.     Cervical:     Right cervical: No superficial or deep cervical adenopathy.    Left cervical: No superficial or deep cervical adenopathy.     Comments: Tender to palpation bilateral cervical chains.   Skin:    General: Skin is warm.     Capillary Refill: Capillary refill takes less than 2 seconds.  Neurological:  General: No focal deficit present.     Mental Status: She is alert, oriented to person, place, and time and easily aroused. Mental status is at baseline.     GCS: GCS eye subscore is 4. GCS verbal subscore is 5. GCS motor subscore is 6.     Motor: No weakness.  Psychiatric:        Attention and Perception: Attention and perception normal.        Mood and Affect: Mood and affect normal.        Speech: Speech normal.        Behavior: Behavior normal. Behavior is cooperative.        Thought Content: Thought content normal. Thought content does not include homicidal or suicidal ideation. Thought content does not include homicidal or suicidal plan.        Cognition and Memory: Cognition and memory normal.        Judgment: Judgment normal.     Results for orders placed or performed in visit on 04/08/22  CBC  Result Value Ref Range   WBC 9.1 3.4 - 10.8 x10E3/uL   RBC 4.23 3.77 - 5.28 x10E6/uL   Hemoglobin 14.1 11.1 - 15.9 g/dL   Hematocrit 81.1 91.4 - 46.6 %   MCV 100 (H) 79 - 97 fL   MCH 33.3 (H) 26.6 - 33.0 pg   MCHC 33.4 31.5 - 35.7 g/dL   RDW 78.2 (L) 95.6 - 21.3 %   Platelets 363 150 - 450 x10E3/uL  TSH  Result Value Ref Range   TSH 1.930 0.450 - 4.500 uIU/mL  Lipid panel  Result Value Ref Range   Cholesterol, Total 247 (H) 100 - 199 mg/dL   Triglycerides 086 0 - 149 mg/dL   HDL 81 >57 mg/dL   VLDL Cholesterol Cal 18 5 - 40 mg/dL   LDL Chol Calc (NIH) 846  (H) 0 - 99 mg/dL   Chol/HDL Ratio 3.0 0.0 - 4.4 ratio  CMP14+EGFR  Result Value Ref Range   Glucose 96 70 - 99 mg/dL   BUN 12 6 - 24 mg/dL   Creatinine, Ser 9.62 0.57 - 1.00 mg/dL   eGFR 952 >84 XL/KGM/0.10   BUN/Creatinine Ratio 17 9 - 23   Sodium 137 134 - 144 mmol/L   Potassium 4.1 3.5 - 5.2 mmol/L   Chloride 101 96 - 106 mmol/L   CO2 22 20 - 29 mmol/L   Calcium 9.3 8.7 - 10.2 mg/dL   Total Protein 7.1 6.0 - 8.5 g/dL   Albumin 4.7 3.9 - 4.9 g/dL   Globulin, Total 2.4 1.5 - 4.5 g/dL   Albumin/Globulin Ratio 2.0 1.2 - 2.2   Bilirubin Total <0.2 0.0 - 1.2 mg/dL   Alkaline Phosphatase 73 44 - 121 IU/L   AST 22 0 - 40 IU/L   ALT 13 0 - 32 IU/L       04/23/2022    3:08 PM 07/17/2021    5:00 PM 12/25/2020    1:57 PM 12/20/2020    9:03 AM 05/12/2020    2:56 PM  Depression screen PHQ 2/9  Decreased Interest 1 1 2 2  0  Down, Depressed, Hopeless 1 1 2 1  0  PHQ - 2 Score 2 2 4 3  0  Altered sleeping 1 0 2 1   Tired, decreased energy 1 1 1 1    Change in appetite 1 1 0 1   Feeling bad or failure about yourself  0 1 1 1    Trouble  concentrating 0 1 1 1    Moving slowly or fidgety/restless 0 1 1 2    Suicidal thoughts 0 0 0 0   PHQ-9 Score 5 7 10 10    Difficult doing work/chores Somewhat difficult Not difficult at all Somewhat difficult Somewhat difficult        04/23/2022    3:07 PM 07/17/2021    5:00 PM 12/25/2020    1:58 PM 12/20/2020    9:03 AM  GAD 7 : Generalized Anxiety Score  Nervous, Anxious, on Edge 1 1 2 2   Control/stop worrying 1 1 2 2   Worry too much - different things 1 1 2 2   Trouble relaxing 0 1 2 2   Restless 0 1 1 2   Easily annoyed or irritable 0 1 1 2   Afraid - awful might happen 0 1 1 2   Total GAD 7 Score 3 7 11 14   Anxiety Difficulty Somewhat difficult Not difficult at all Somewhat difficult Somewhat difficult    Pertinent labs & imaging results that were available during my care of the patient were reviewed by me and considered in my medical decision  making.  Assessment & Plan:  Danaja was seen today for ear fullness.  Diagnoses and all orders for this visit:  Other non-recurrent acute nonsuppurative otitis media of both ears Will start medication as below.  -     amoxicillin-clavulanate (AUGMENTIN) 875-125 MG tablet; Take 1 tablet by mouth 2 (two) times daily for 7 days.  Sore throat Labs as below. Will communicate results to patient once available. Will await results to determine next steps.  Negative for strep on rapid screen. Patient notified during visit. If positive for viral illness, will plan to stop abx and discuss other options.  -     COVID-19, Flu A+B and RSV -     Rapid Strep Screen ([facility] ONLY); Future -     Culture, Group A Strep; Future -     Rapid Strep Screen ([facility] ONLY) -     Culture, Group A Strep -     Culture, Group A Strep  Essential hypertension Elevated BP in office today. Patient did not take daily medication before appt. Patient to take amlodipine once she gets home. Patient to follow up with PCP. Reports that she has a monitor and checks her BP frequently. Patient to bring in results to PCP for follow up.     Continue all other maintenance medications.  Follow up plan: Return if symptoms worsen or fail to improve.  Continue healthy lifestyle choices, including diet (rich in fruits, vegetables, and lean proteins, and low in salt and simple carbohydrates) and exercise (at least 30 minutes of moderate physical activity daily).  Written and verbal instructions provided   The above assessment and management plan was discussed with the patient. The patient verbalized understanding of and has agreed to the management plan. Patient is aware to call the clinic if they develop any new symptoms or if symptoms persist or worsen. Patient is aware when to return to the clinic for a follow-up visit. Patient educated on when it is appropriate to go to the emergency department.   Neale Burly,  DNP-FNP [facility] [address] [place], [place] [address] [phone]

## 2023-01-22 LAB — COVID-19, FLU A+B AND RSV
Influenza A, NAA: NOT DETECTED
Influenza B, NAA: NOT DETECTED
RSV, NAA: NOT DETECTED
SARS-CoV-2, NAA: NOT DETECTED

## 2023-01-23 LAB — CULTURE, GROUP A STREP: Strep A Culture: NEGATIVE

## 2023-01-24 NOTE — Progress Notes (Signed)
Negative for all infections tested. Please have patient follow up if still experiencing symptoms.

## 2023-01-27 ENCOUNTER — Telehealth: Payer: Self-pay | Admitting: Family Medicine

## 2023-01-27 NOTE — Telephone Encounter (Signed)
Copied from CRM [id number]. Topic: Clinical - Medical Advice >> Jan 27, 2023  3:04 PM Almira Coaster wrote: Reason for CRM: Patient called to advise that the ear infection has not improved, tomorrow will be the 7th day of taking her antibiotics and wants to know if there is anything else that can be recommended or if she needs to schedule another appointment. Original date of service 01/21/2023.

## 2023-01-28 ENCOUNTER — Telehealth: Payer: Self-pay | Admitting: Family Medicine

## 2023-01-28 NOTE — Telephone Encounter (Signed)
Copied from CRM [id number]. Topic: Clinical - Medical Advice >> Jan 28, 2023 11:14 AM Angela Hull wrote: Reason for CRM: PT IS REQUESTING A NEW ANTIBIOTIC DUE TO THE ONE SHE WAS PRESCRIBED LAST WEEK DID NOT HELP. STILL HAS ALL THE SAME SYMPTOMS

## 2023-01-29 NOTE — Telephone Encounter (Signed)
Yes schedule an appointment so we can have her come in and take a look at it and see what is going on.

## 2023-01-29 NOTE — Telephone Encounter (Signed)
Spoke with pt. States that she is feeling better now. Had vomiting and lots of phlegm came up. Declines an appt. Pt is upset that it took 2 days for our office to return call. Apologized.

## 2023-05-05 ENCOUNTER — Other Ambulatory Visit: Payer: Self-pay | Admitting: Family Medicine

## 2023-05-05 ENCOUNTER — Telehealth: Payer: Self-pay | Admitting: Family Medicine

## 2023-05-05 DIAGNOSIS — I10 Essential (primary) hypertension: Secondary | ICD-10-CM

## 2023-05-05 DIAGNOSIS — F5104 Psychophysiologic insomnia: Secondary | ICD-10-CM

## 2023-05-05 DIAGNOSIS — F411 Generalized anxiety disorder: Secondary | ICD-10-CM

## 2023-05-05 MED ORDER — AMLODIPINE BESYLATE 10 MG PO TABS
10.0000 mg | ORAL_TABLET | Freq: Every day | ORAL | 3 refills | Status: DC
Start: 2023-05-05 — End: 2023-06-03

## 2023-05-05 MED ORDER — TRAZODONE HCL 50 MG PO TABS: ORAL_TABLET | ORAL | 3 refills | Status: DC

## 2023-05-05 MED ORDER — CITALOPRAM HYDROBROMIDE 40 MG PO TABS: 40.0000 mg | ORAL_TABLET | Freq: Every day | ORAL | 3 refills | Status: DC

## 2023-05-05 MED ORDER — HYDROCHLOROTHIAZIDE 25 MG PO TABS: 25.0000 mg | ORAL_TABLET | Freq: Every day | ORAL | 3 refills | Status: DC

## 2023-05-05 NOTE — Telephone Encounter (Signed)
 Of course. Rx sent  Meds ordered this encounter  Medications   amLODipine (NORVASC) 10 MG tablet    Sig: Take 1 tablet (10 mg total) by mouth daily.    Dispense:  90 tablet    Refill:  3   citalopram (CELEXA) 40 MG tablet    Sig: Take 1 tablet (40 mg total) by mouth daily.    Dispense:  90 tablet    Refill:  3   hydrochlorothiazide (HYDRODIURIL) 25 MG tablet    Sig: Take 1 tablet (25 mg total) by mouth daily.    Dispense:  90 tablet    Refill:  3   traZODone (DESYREL) 50 MG tablet    Sig: TAKE 1 TO 1&1/2 TABLETS AT BEDTIME AS NEEDED FOR SLEEP    Dispense:  135 tablet    Refill:  3

## 2023-05-05 NOTE — Telephone Encounter (Signed)
 Copied from CRM [id number]. Topic: Clinical - Prescription Issue >> May 05, 2023 12:09 PM Antwanette L wrote: Reason for CRM: Patient will be leaving for [place] tomorrow and she needs a refill on amLODipine (NORVASC) 10 MG tablet, citalopram (CELEXA) 40 MG tablet, hydrochlorothiazide (HYDRODIURIL) 25 MG tablet, and traZODone (DESYREL) 50 MG tablet. Patient contacted her pharmacy and the told her she needs to make an appointment in order to get a refill. Patient ran out of her medication on 05/03/22. Patient has an upcoming appointment on 03/25 with Dr. Liana Crocker. Patient needs her medication and would like to know if Dr. Nadine Counts would prescribe a two week supply until her appointment? Patient can be contacted by phone at [phone]. Listed below is the patient preferred pharmacy.   [facility]  [address] [place], [place] [address] Phone: [phone]  Fax: [phone]

## 2023-05-05 NOTE — Telephone Encounter (Signed)
 [facility] last OV 04/23/22 NO RF sent  to pharmacy last OV greater than a year

## 2023-05-05 NOTE — Telephone Encounter (Signed)
 Duplicate call refer to other call.

## 2023-05-05 NOTE — Addendum Note (Signed)
 Addended by: Raliegh Ip on: 05/05/2023 03:02 PM   Modules accepted: Orders

## 2023-05-05 NOTE — Telephone Encounter (Signed)
 Copied from CRM [id number]. Topic: Clinical - Prescription Issue >> May 05, 2023 11:47 AM Angela Hull wrote: Reason for CRM: Pt scheduled appt for med refills, but she is leaving for [place] tonight to fly out to [place]. She says she cannot leave for this trip without her 4 medications as her anxiety will have her petrified to fly. She is asking if someone can call her back today and get the meds filled today.

## 2023-06-03 ENCOUNTER — Ambulatory Visit: Payer: 59 | Admitting: Family Medicine

## 2023-06-03 ENCOUNTER — Encounter: Payer: Self-pay | Admitting: Family Medicine

## 2023-06-03 VITALS — BP 169/97 | HR 86 | Temp 98.5°F | Ht 67.0 in | Wt 184.8 lb

## 2023-06-03 DIAGNOSIS — Z0001 Encounter for general adult medical examination with abnormal findings: Secondary | ICD-10-CM | POA: Diagnosis not present

## 2023-06-03 DIAGNOSIS — Z720 Tobacco use: Secondary | ICD-10-CM

## 2023-06-03 DIAGNOSIS — E663 Overweight: Secondary | ICD-10-CM | POA: Diagnosis not present

## 2023-06-03 DIAGNOSIS — Z0000 Encounter for general adult medical examination without abnormal findings: Secondary | ICD-10-CM

## 2023-06-03 DIAGNOSIS — F5104 Psychophysiologic insomnia: Secondary | ICD-10-CM

## 2023-06-03 DIAGNOSIS — F411 Generalized anxiety disorder: Secondary | ICD-10-CM

## 2023-06-03 DIAGNOSIS — G35 Multiple sclerosis: Secondary | ICD-10-CM | POA: Diagnosis not present

## 2023-06-03 DIAGNOSIS — N912 Amenorrhea, unspecified: Secondary | ICD-10-CM

## 2023-06-03 DIAGNOSIS — Z1211 Encounter for screening for malignant neoplasm of colon: Secondary | ICD-10-CM

## 2023-06-03 DIAGNOSIS — I10 Essential (primary) hypertension: Secondary | ICD-10-CM | POA: Diagnosis not present

## 2023-06-03 MED ORDER — CITALOPRAM HYDROBROMIDE 40 MG PO TABS: 40.0000 mg | ORAL_TABLET | Freq: Every day | ORAL | 3 refills | Status: AC

## 2023-06-03 MED ORDER — LOSARTAN POTASSIUM 50 MG PO TABS: 50.0000 mg | ORAL_TABLET | Freq: Every day | ORAL | 3 refills | Status: AC

## 2023-06-03 MED ORDER — AMLODIPINE BESYLATE 10 MG PO TABS: 10.0000 mg | ORAL_TABLET | Freq: Every day | ORAL | 3 refills | Status: AC

## 2023-06-03 MED ORDER — HYDROCHLOROTHIAZIDE 25 MG PO TABS: 25.0000 mg | ORAL_TABLET | Freq: Every day | ORAL | 3 refills | Status: AC

## 2023-06-03 MED ORDER — TRAZODONE HCL 50 MG PO TABS: ORAL_TABLET | ORAL | 3 refills | Status: AC

## 2023-06-03 NOTE — Progress Notes (Signed)
 Angela Hull is a 48 y.o. female presents to office today for annual physical exam examination.    Concerns today include: 1.  None.  She is little bit anxious following a trip to [place] but she reports that was one of the best trip she is ever had.  She continues to smoke 1 pack/day and has done so for 32 years.  No hemoptysis, unplanned weight loss, chest pain.  No difficulty swallowing.  She has had a little bit of postnasal drainage but think this is due to allergies.  She denies any shortness of breath, wheezing.  Occupation: retired , Marital status: married, Substance use: tobacco There are no preventive care reminders to display for this patient. Refills needed today: all  Immunization History  Administered Date(s) Administered   Influenza,inj,Quad PF,6+ Mos 12/31/2017, 12/25/2018   Moderna Sars-Covid-2 Vaccination 05/25/2019, 06/22/2019, 02/08/2020   Td 05/12/2018   Tdap 05/12/2018   Past Medical History:  Diagnosis Date   Blurred vision    Hypertension    Manic depressive disorder (HCC)    Multiple sclerosis (HCC)    PONV (postoperative nausea and vomiting)    Social History   Socioeconomic History   Marital status: Married    Spouse name: Not on file   Number of children: Not on file   Years of education: Not on file   Highest education level: Not on file  Occupational History   Not on file  Tobacco Use   Smoking status: Every Day    Current packs/day: 0.50    Types: Cigarettes   Smokeless tobacco: Never  Vaping Use   Vaping status: Never Used  Substance and Sexual Activity   Alcohol use: Yes    Comment: Occasional   Drug use: No   Sexual activity: Yes  Other Topics Concern   Not on file  Social History Narrative   Not on file   Social Drivers of Health   Financial Resource Strain: Not on file  Food Insecurity: Not on file  Transportation Needs: Not on file  Physical Activity: Not on file  Stress: Not on file  Social Connections: Not on  file  Intimate Partner Violence: Not on file   Past Surgical History:  Procedure Laterality Date   CESAREAN SECTION     INCISION AND DRAINAGE Right 05/21/2018   Procedure: INCISION AND DRAINAGE OF RIGHT INDEX FINGER AND THUMB;  Surgeon: Cindee Salt, MD;  Location: [facility];  Service: Orthopedics;  Laterality: Right;  FAB   SPINE SURGERY     Uterine Ablation     Family History  Problem Relation Age of Onset   Asthma Maternal Grandmother     Current Outpatient Medications:    amLODipine (NORVASC) 10 MG tablet, Take 1 tablet (10 mg total) by mouth daily., Disp: 90 tablet, Rfl: 3   citalopram (CELEXA) 40 MG tablet, Take 1 tablet (40 mg total) by mouth daily., Disp: 90 tablet, Rfl: 3   hydrochlorothiazide (HYDRODIURIL) 25 MG tablet, Take 1 tablet (25 mg total) by mouth daily., Disp: 90 tablet, Rfl: 3   traZODone (DESYREL) 50 MG tablet, TAKE 1 TO 1&1/2 TABLETS AT BEDTIME AS NEEDED FOR SLEEP, Disp: 135 tablet, Rfl: 3  Allergies  Allergen Reactions   Seroquel [Quetiapine]     Caused delirium     ROS: Review of Systems Pertinent items noted in HPI and remainder of comprehensive ROS otherwise negative.    Physical exam BP (!) 169/97   Pulse 86  Temp 98.5 F (36.9 C)   Ht 5\' 7"  (1.702 m)   Wt 184 lb 12.8 oz (83.8 kg)   SpO2 94%   BMI 28.94 kg/m  General appearance: alert, cooperative, appears stated age, and no distress Head: Normocephalic, without obvious abnormality, atraumatic Eyes: negative findings: lids and lashes normal, conjunctivae and sclerae normal, corneas clear, and pupils equal, round, reactive to light and accomodation Ears: normal TM's and external ear canals both ears Nose: Nares normal. Septum midline. Mucosa normal. No drainage or sinus tenderness. Throat: lips, mucosa, and tongue normal; teeth and gums normal no oropharyngeal masses. Smells of tobacco Neck: no adenopathy, supple, symmetrical, trachea midline, and thyroid not enlarged,  symmetric, no tenderness/mass/nodules Back: symmetric, no curvature. ROM normal. No CVA tenderness. Lungs:  wheezing on right lung fields that cleared with coughing Heart: regular rate and rhythm, S1, S2 normal, no murmur, click, rub or gallop Abdomen: soft, non-tender; bowel sounds normal; no masses,  no organomegaly Extremities: extremities normal, atraumatic, no cyanosis or edema Pulses: 2+ and symmetric Skin:  Some areas of pigment loss along the right jawline.  She has a seborrheic keratosis along the left breast.  Several pigmented skin lesions Lymph nodes: Cervical, supraclavicular, and axillary nodes normal. Neurologic: Grossly normal      06/03/2023    1:19 PM 04/23/2022    3:08 PM 07/17/2021    5:00 PM  Depression screen PHQ 2/9  Decreased Interest 1 1 1   Down, Depressed, Hopeless 1 1 1   PHQ - 2 Score 2 2 2   Altered sleeping 0 1 0  Tired, decreased energy 0 1 1  Change in appetite 0 1 1  Feeling bad or failure about yourself  1 0 1  Trouble concentrating 0 0 1  Moving slowly or fidgety/restless 0 0 1  Suicidal thoughts 0 0 0  PHQ-9 Score 3 5 7   Difficult doing work/chores  Somewhat difficult Not difficult at all      06/03/2023    1:19 PM 04/23/2022    3:07 PM 07/17/2021    5:00 PM 12/25/2020    1:58 PM  GAD 7 : Generalized Anxiety Score  Nervous, Anxious, on Edge 1 1 1 2   Control/stop worrying 2 1 1 2   Worry too much - different things 1 1 1 2   Trouble relaxing 1 0 1 2  Restless 1 0 1 1  Easily annoyed or irritable 1 0 1 1  Afraid - awful might happen 1 0 1 1  Total GAD 7 Score 8 3 7 11   Anxiety Difficulty Somewhat difficult Somewhat difficult Not difficult at all Somewhat difficult     Assessment/ Plan: Angela Hull here for annual physical exam.   Annual physical exam  Colon cancer screening - Plan: Ambulatory referral to Gastroenterology  Essential hypertension - Plan: amLODipine (NORVASC) 10 MG tablet, hydrochlorothiazide (HYDRODIURIL) 25 MG tablet,  Lipid Panel, CMP14+EGFR, losartan (COZAAR) 50 MG tablet  Generalized anxiety disorder - Plan: citalopram (CELEXA) 40 MG tablet, TSH + free T4, VITAMIN D 25 Hydroxy (Vit-D Deficiency, Fractures)  Psychophysiological insomnia - Plan: traZODone (DESYREL) 50 MG tablet, TSH + free T4  Tobacco use - Plan: CBC with Differential/Platelet  Multiple sclerosis (HCC) - Plan: CBC with Differential/Platelet, CMP14+EGFR  Overweight (BMI 25.0-29.9) - Plan: Bayer DCA Hb A1c Waived, CMP14+EGFR  Absence of menstruation - Plan: FSH/LH  She will set up mammogram.  Referral to gastroenterology for colon cancer screening placed.  She declines vaccines today.  I counseled her on smoking  cessation.  Will plan to check CBC  Anxiety and insomnia chronic and stable.  Medications have been renewed.  Future orders for fasting labs placed.  Blood pressure was not controlled some going to add ARB.  She will continue amlodipine, HCTZ.  Losartan 50 mg added and she will follow-up for blood pressure recheck and fasting labs sometime next week  MS has been chronic and stable.  Not currently treated  [facility] and LH ordered given absence of menses.  Likely secondary to ablation but given family history of early menopause will evaluate for this  Counseled on healthy lifestyle choices, including diet (rich in fruits, vegetables and lean meats and low in salt and simple carbohydrates) and exercise (at least 30 minutes of moderate physical activity daily).  Patient to follow up 1 year for CPE  Teaghan Formica M. Nadine Counts, DO

## 2023-06-03 NOTE — Patient Instructions (Signed)
 Preventive Care 16-46 Years Old, Female  Preventive care refers to lifestyle choices and visits with your health care provider that can promote health and wellness. Preventive care visits are also called wellness exams.  What can I expect for my preventive care visit?  Counseling  Your health care provider may ask you questions about your:  Medical history, including:  Past medical problems.  Family medical history.  Pregnancy history.  Current health, including:  Menstrual cycle.  Method of birth control.  Emotional well-being.  Home life and relationship well-being.  Sexual activity and sexual health.  Lifestyle, including:  Alcohol, nicotine or tobacco, and drug use.  Access to firearms.  Diet, exercise, and sleep habits.  Work and work Astronomer.  Sunscreen use.  Safety issues such as seatbelt and bike helmet use.  Physical exam  Your health care provider will check your:  Height and weight. These may be used to calculate your BMI (body mass index). BMI is a measurement that tells if you are at a healthy weight.  Waist circumference. This measures the distance around your waistline. This measurement also tells if you are at a healthy weight and may help predict your risk of certain diseases, such as type 2 diabetes and high blood pressure.  Heart rate and blood pressure.  Body temperature.  Skin for abnormal spots.  What immunizations do I need?    Vaccines are usually given at various ages, according to a schedule. Your health care provider will recommend vaccines for you based on your age, medical history, and lifestyle or other factors, such as travel or where you work.  What tests do I need?  Screening  Your health care provider may recommend screening tests for certain conditions. This may include:  Lipid and cholesterol levels.  Diabetes screening. This is done by checking your blood sugar (glucose) after you have not eaten for a while (fasting).  Pelvic exam and Pap test.  Hepatitis B test.  Hepatitis C  test.  HIV (human immunodeficiency virus) test.  STI (sexually transmitted infection) testing, if you are at risk.  Lung cancer screening.  Colorectal cancer screening.  Mammogram. Talk with your health care provider about when you should start having regular mammograms. This may depend on whether you have a family history of breast cancer.  BRCA-related cancer screening. This may be done if you have a family history of breast, ovarian, tubal, or peritoneal cancers.  Bone density scan. This is done to screen for osteoporosis.  Talk with your health care provider about your test results, treatment options, and if necessary, the need for more tests.  Follow these instructions at home:  Eating and drinking    Eat a diet that includes fresh fruits and vegetables, whole grains, lean protein, and low-fat dairy products.  Take vitamin and mineral supplements as recommended by your health care provider.  Do not drink alcohol if:  Your health care provider tells you not to drink.  You are pregnant, may be pregnant, or are planning to become pregnant.  If you drink alcohol:  Limit how much you have to 0-1 drink a day.  Know how much alcohol is in your drink. In the [place] one drink equals one 12 oz bottle of beer (355 mL), one 5 oz glass of wine (148 mL), or one 1 oz glass of hard liquor (44 mL).  Lifestyle  Brush your teeth every morning and night with fluoride toothpaste. Floss one time each day.  Exercise for at least  30 minutes 5 or more days each week.  Do not use any products that contain nicotine or tobacco. These products include cigarettes, chewing tobacco, and vaping devices, such as e-cigarettes. If you need help quitting, ask your health care provider.  Do not use drugs.  If you are sexually active, practice safe sex. Use a condom or other form of protection to prevent STIs.  If you do not wish to become pregnant, use a form of birth control. If you plan to become pregnant, see your health care provider for a  prepregnancy visit.  Take aspirin only as told by your health care provider. Make sure that you understand how much to take and what form to take. Work with your health care provider to find out whether it is safe and beneficial for you to take aspirin daily.  Find healthy ways to manage stress, such as:  Meditation, yoga, or listening to music.  Journaling.  Talking to a trusted person.  Spending time with friends and family.  Minimize exposure to UV radiation to reduce your risk of skin cancer.  Safety  Always wear your seat belt while driving or riding in a vehicle.  Do not drive:  If you have been drinking alcohol. Do not ride with someone who has been drinking.  When you are tired or distracted.  While texting.  If you have been using any mind-altering substances or drugs.  Wear a helmet and other protective equipment during sports activities.  If you have firearms in your house, make sure you follow all gun safety procedures.  Seek help if you have been physically or sexually abused.  What's next?  Visit your health care provider once a year for an annual wellness visit.  Ask your health care provider how often you should have your eyes and teeth checked.  Stay up to date on all vaccines.  This information is not intended to replace advice given to you by your health care provider. Make sure you discuss any questions you have with your health care provider.  Document Revised: 08/23/2020 Document Reviewed: 08/23/2020  Elsevier Patient Education  2024 [employer].

## 2023-06-10 ENCOUNTER — Telehealth: Payer: Self-pay

## 2023-06-10 NOTE — Telephone Encounter (Signed)
 Already noted in chart. LS

## 2023-06-10 NOTE — Telephone Encounter (Signed)
 Copied from CRM [id number]. Topic: Clinical - Medical Advice >> Jun 10, 2023  3:08 PM Fuller Mandril wrote: Reason for CRM: Patient called. Wanted to make a note for next appt to ask provider if cologuard would be an option for her or see what her options are for that testing. Thank You

## 2023-06-17 ENCOUNTER — Ambulatory Visit

## 2023-06-18 ENCOUNTER — Telehealth: Payer: Self-pay

## 2023-06-18 NOTE — Telephone Encounter (Signed)
 Copied from CRM [id number]. Topic: Clinical - Prescription Issue >> Jun 18, 2023  3:43 PM Alcus Dad wrote: Reason for CRM: Patient stated that she usually takes amLODipine (NORVASC) 10 MG tablet and when patient went to pick up medication it was totally different than the medication she is currently on. Please call patient back regarding this matter

## 2023-06-18 NOTE — Telephone Encounter (Signed)
 Contacted patient and inquired about her medications. I explained that Dr. Delynn Flavin instructions state, take losartan, amlodopine and hydrochlorothiazide for B/P. She thanked me for clarification.

## 2023-06-24 ENCOUNTER — Ambulatory Visit (INDEPENDENT_AMBULATORY_CARE_PROVIDER_SITE_OTHER)

## 2023-06-24 ENCOUNTER — Other Ambulatory Visit: Payer: Self-pay

## 2023-06-24 ENCOUNTER — Other Ambulatory Visit

## 2023-06-24 DIAGNOSIS — G35 Multiple sclerosis: Secondary | ICD-10-CM

## 2023-06-24 DIAGNOSIS — N912 Amenorrhea, unspecified: Secondary | ICD-10-CM

## 2023-06-24 DIAGNOSIS — Z1211 Encounter for screening for malignant neoplasm of colon: Secondary | ICD-10-CM

## 2023-06-24 DIAGNOSIS — E663 Overweight: Secondary | ICD-10-CM

## 2023-06-24 DIAGNOSIS — Z720 Tobacco use: Secondary | ICD-10-CM | POA: Diagnosis not present

## 2023-06-24 DIAGNOSIS — F5104 Psychophysiologic insomnia: Secondary | ICD-10-CM | POA: Diagnosis not present

## 2023-06-24 DIAGNOSIS — F411 Generalized anxiety disorder: Secondary | ICD-10-CM

## 2023-06-24 DIAGNOSIS — I10 Essential (primary) hypertension: Secondary | ICD-10-CM | POA: Diagnosis not present

## 2023-06-24 LAB — LIPID PANEL

## 2023-06-24 LAB — BAYER DCA HB A1C WAIVED: HB A1C (BAYER DCA - WAIVED): 4.9 % (ref 4.8–5.6)

## 2023-06-24 NOTE — Progress Notes (Signed)
 Patient is in office today for a nurse visit for Blood Pressure Check. Patient blood pressure was 135/86, Patient No chest pain

## 2023-06-25 ENCOUNTER — Other Ambulatory Visit: Payer: Self-pay | Admitting: Family Medicine

## 2023-06-25 DIAGNOSIS — E876 Hypokalemia: Secondary | ICD-10-CM

## 2023-06-25 DIAGNOSIS — E559 Vitamin D deficiency, unspecified: Secondary | ICD-10-CM

## 2023-06-25 LAB — CMP14+EGFR
ALT: 30 IU/L (ref 0–32)
AST: 32 IU/L (ref 0–40)
Albumin: 4.2 g/dL (ref 3.9–4.9)
Alkaline Phosphatase: 98 IU/L (ref 44–121)
BUN/Creatinine Ratio: 9 (ref 9–23)
BUN: 5 mg/dL — ABNORMAL LOW (ref 6–24)
Bilirubin Total: 0.5 mg/dL (ref 0.0–1.2)
CO2: 25 mmol/L (ref 20–29)
Calcium: 9.2 mg/dL (ref 8.7–10.2)
Chloride: 87 mmol/L — ABNORMAL LOW (ref 96–106)
Creatinine, Ser: 0.57 mg/dL (ref 0.57–1.00)
Globulin, Total: 2.5 g/dL (ref 1.5–4.5)
Glucose: 102 mg/dL — ABNORMAL HIGH (ref 70–99)
Potassium: 3.2 mmol/L — ABNORMAL LOW (ref 3.5–5.2)
Sodium: 129 mmol/L — ABNORMAL LOW (ref 134–144)
Total Protein: 6.7 g/dL (ref 6.0–8.5)
eGFR: 113 mL/min/{1.73_m2} (ref >59)

## 2023-06-25 LAB — LIPID PANEL
Cholesterol, Total: 224 mg/dL — ABNORMAL HIGH (ref 100–199)
HDL: 67 mg/dL (ref >39)
LDL CALC COMMENT:: 3.3 ratio (ref 0.0–4.4)
LDL Chol Calc (NIH): 144 mg/dL — ABNORMAL HIGH (ref 0–99)
Triglycerides: 72 mg/dL (ref 0–149)
VLDL Cholesterol Cal: 13 mg/dL (ref 5–40)

## 2023-06-25 LAB — CBC WITH DIFFERENTIAL/PLATELET
Basophils Absolute: 0.1 10*3/uL (ref 0.0–0.2)
Basos: 1 %
EOS (ABSOLUTE): 0.2 10*3/uL (ref 0.0–0.4)
Eos: 3 %
Hematocrit: 42.3 % (ref 34.0–46.6)
Hemoglobin: 15.4 g/dL (ref 11.1–15.9)
Immature Grans (Abs): 0.1 10*3/uL (ref 0.0–0.1)
Immature Granulocytes: 1 %
Lymphocytes Absolute: 1.1 10*3/uL (ref 0.7–3.1)
Lymphs: 13 %
MCH: 36 pg — ABNORMAL HIGH (ref 26.6–33.0)
MCHC: 36.4 g/dL — ABNORMAL HIGH (ref 31.5–35.7)
MCV: 99 fL — ABNORMAL HIGH (ref 79–97)
Monocytes Absolute: 0.7 10*3/uL (ref 0.1–0.9)
Monocytes: 8 %
Neutrophils Absolute: 6.3 10*3/uL (ref 1.4–7.0)
Neutrophils: 74 %
Platelets: 284 10*3/uL (ref 150–450)
RBC: 4.28 x10E6/uL (ref 3.77–5.28)
RDW: 11.7 % (ref 11.7–15.4)
WBC: 8.4 10*3/uL (ref 3.4–10.8)

## 2023-06-25 LAB — FSH/LH
FSH: 7.2 m[IU]/mL
LH: 8.5 m[IU]/mL

## 2023-06-25 LAB — TSH+FREE T4
Free T4: 0.93 ng/dL (ref 0.82–1.77)
TSH: 0.481 u[IU]/mL (ref 0.450–4.500)

## 2023-06-25 LAB — VITAMIN D 25 HYDROXY (VIT D DEFICIENCY, FRACTURES): Vit D, 25-Hydroxy: 29.4 ng/mL — ABNORMAL LOW (ref 30.0–100.0)

## 2023-06-25 MED ORDER — VITAMIN D (ERGOCALCIFEROL) 1.25 MG (50000 UNIT) PO CAPS: 50000.0000 [IU] | ORAL_CAPSULE | ORAL | 0 refills | Status: AC

## 2023-06-25 MED ORDER — POTASSIUM CHLORIDE CRYS ER 20 MEQ PO TBCR: 20.0000 meq | EXTENDED_RELEASE_TABLET | Freq: Two times a day (BID) | ORAL | 0 refills | Status: AC

## 2023-06-25 NOTE — Progress Notes (Signed)
 Meds ordered this encounter  Medications   Vitamin D, Ergocalciferol, (DRISDOL) 1.25 MG (50000 UNIT) CAPS capsule    Sig: Take 1 capsule (50,000 Units total) by mouth every 7 (seven) days. X8 weeks, then take OTC Vit D 400 international units daily    Dispense:  8 capsule    Refill:  0   potassium chloride SA (KLOR-CON M) 20 MEQ tablet    Sig: Take 1 tablet (20 mEq total) by mouth 2 (two) times daily for 5 days.    Dispense:  10 tablet    Refill:  0

## 2023-06-30 ENCOUNTER — Encounter: Payer: Self-pay | Admitting: Family Medicine

## 2023-07-10 ENCOUNTER — Telehealth: Payer: Self-pay | Admitting: Family Medicine

## 2023-07-10 NOTE — Telephone Encounter (Unsigned)
 Copied from CRM [id number]. Topic: General - Other >> Jul 09, 2023  4:51 PM Tiffany H wrote: Reason for CRM: Patient called to advise that she'd like to speak with Dr. Bonnell Butcher tomorrow. She'll be traveling via train during the day. She declined to speak about what she needs but patient advised that she doesn't have any urgent symptoms. Issue is medical (non-urgent), but also administrative (urgent), please assist.

## 2023-07-10 NOTE — Telephone Encounter (Signed)
 Dr Bonnell Butcher is on vacation and wont be back until 07/21/2023.  Copied from CRM [id number]. Topic: General - Other >> Jul 09, 2023  4:51 PM Tiffany H wrote: Reason for CRM: Patient called to advise that she'd like to speak with Dr. Bonnell Butcher tomorrow. She'll be traveling via train during the day. She declined to speak about what she needs but patient advised that she doesn't have any urgent symptoms. Issue is medical (non-urgent), but also administrative (urgent), please assist.

## 2023-09-17 ENCOUNTER — Ambulatory Visit: Payer: Self-pay

## 2023-09-17 NOTE — Telephone Encounter (Signed)
 Chart reviewed last rx Ketoconazole  09/23/2019  1st attempt. Mailbox full, unable to leave message.    Copied from CRM [id number]. Topic: Clinical - Medication Question >> Sep 17, 2023  3:10 PM Donna E wrote: Reason for CRM: patient calling, patient was given a yeast cream for yeast rash under her breasts, patient has a rash under both breasts due to heat. She would like a prescription for the cream again.   Pharmacy   [facility] [place], [place] - [address] [place] [address] Phone: [phone] Fax: [phone]

## 2023-09-17 NOTE — Telephone Encounter (Signed)
 3rd attempt to reach patient, mailbox is full and unable to leave message. Per workflow routing to clinic for further follow up.  Reason for Disposition . Third attempt to contact caller AND no contact made. Phone number verified.  Protocols used: No Contact or Duplicate Contact Call-A-AH

## 2023-09-17 NOTE — Telephone Encounter (Signed)
 Attempted to contact pt, unable to leave v/m.

## 2023-12-10 ENCOUNTER — Other Ambulatory Visit: Payer: Self-pay | Admitting: Family Medicine

## 2023-12-10 DIAGNOSIS — E559 Vitamin D deficiency, unspecified: Secondary | ICD-10-CM

## 2024-06-04 ENCOUNTER — Encounter: Payer: Self-pay | Admitting: Family Medicine
